# Patient Record
Sex: Female | Born: 1995
Health system: Southern US, Community
[De-identification: ages and names within clinical notes are randomized; demographics above are authoritative.]

## PROBLEM LIST (undated history)

## (undated) DIAGNOSIS — F419 Anxiety disorder, unspecified: Secondary | ICD-10-CM

## (undated) DIAGNOSIS — F5000 Anorexia nervosa, unspecified: Secondary | ICD-10-CM

## (undated) HISTORY — DX: Anorexia nervosa, unspecified: F50.00

## (undated) HISTORY — DX: Anxiety disorder, unspecified: F41.9

---

## 2014-03-08 DIAGNOSIS — F509 Eating disorder, unspecified: Secondary | ICD-10-CM | POA: Insufficient documentation

## 2014-09-28 DIAGNOSIS — F4322 Adjustment disorder with anxiety: Secondary | ICD-10-CM | POA: Insufficient documentation

## 2016-03-05 DIAGNOSIS — F5001 Anorexia nervosa, restricting type: Secondary | ICD-10-CM | POA: Diagnosis not present

## 2016-03-08 DIAGNOSIS — F5001 Anorexia nervosa, restricting type: Secondary | ICD-10-CM | POA: Diagnosis not present

## 2016-03-13 DIAGNOSIS — F5001 Anorexia nervosa, restricting type: Secondary | ICD-10-CM | POA: Diagnosis not present

## 2016-03-14 DIAGNOSIS — F5001 Anorexia nervosa, restricting type: Secondary | ICD-10-CM | POA: Diagnosis not present

## 2016-03-15 DIAGNOSIS — Z124 Encounter for screening for malignant neoplasm of cervix: Secondary | ICD-10-CM | POA: Diagnosis not present

## 2016-03-15 DIAGNOSIS — R875 Abnormal microbiological findings in specimens from female genital organs: Secondary | ICD-10-CM | POA: Diagnosis not present

## 2016-03-20 DIAGNOSIS — F5001 Anorexia nervosa, restricting type: Secondary | ICD-10-CM | POA: Diagnosis not present

## 2016-03-21 DIAGNOSIS — F5001 Anorexia nervosa, restricting type: Secondary | ICD-10-CM | POA: Diagnosis not present

## 2016-03-27 DIAGNOSIS — F5001 Anorexia nervosa, restricting type: Secondary | ICD-10-CM | POA: Diagnosis not present

## 2016-03-28 DIAGNOSIS — F5001 Anorexia nervosa, restricting type: Secondary | ICD-10-CM | POA: Diagnosis not present

## 2016-04-03 DIAGNOSIS — F5001 Anorexia nervosa, restricting type: Secondary | ICD-10-CM | POA: Diagnosis not present

## 2016-04-04 DIAGNOSIS — F5001 Anorexia nervosa, restricting type: Secondary | ICD-10-CM | POA: Diagnosis not present

## 2016-04-10 DIAGNOSIS — F5001 Anorexia nervosa, restricting type: Secondary | ICD-10-CM | POA: Diagnosis not present

## 2016-04-11 DIAGNOSIS — F5001 Anorexia nervosa, restricting type: Secondary | ICD-10-CM | POA: Diagnosis not present

## 2016-04-16 DIAGNOSIS — F5001 Anorexia nervosa, restricting type: Secondary | ICD-10-CM | POA: Diagnosis not present

## 2016-04-18 DIAGNOSIS — F5001 Anorexia nervosa, restricting type: Secondary | ICD-10-CM | POA: Diagnosis not present

## 2016-04-24 DIAGNOSIS — F5001 Anorexia nervosa, restricting type: Secondary | ICD-10-CM | POA: Diagnosis not present

## 2016-04-25 DIAGNOSIS — F5001 Anorexia nervosa, restricting type: Secondary | ICD-10-CM | POA: Diagnosis not present

## 2016-05-01 DIAGNOSIS — F5001 Anorexia nervosa, restricting type: Secondary | ICD-10-CM | POA: Diagnosis not present

## 2016-05-07 DIAGNOSIS — F5001 Anorexia nervosa, restricting type: Secondary | ICD-10-CM | POA: Diagnosis not present

## 2016-05-13 DIAGNOSIS — F5001 Anorexia nervosa, restricting type: Secondary | ICD-10-CM | POA: Diagnosis not present

## 2016-05-22 DIAGNOSIS — F5001 Anorexia nervosa, restricting type: Secondary | ICD-10-CM | POA: Diagnosis not present

## 2016-05-23 DIAGNOSIS — F5001 Anorexia nervosa, restricting type: Secondary | ICD-10-CM | POA: Diagnosis not present

## 2016-05-29 DIAGNOSIS — F5001 Anorexia nervosa, restricting type: Secondary | ICD-10-CM | POA: Diagnosis not present

## 2016-05-30 DIAGNOSIS — F5001 Anorexia nervosa, restricting type: Secondary | ICD-10-CM | POA: Diagnosis not present

## 2016-06-05 DIAGNOSIS — F5001 Anorexia nervosa, restricting type: Secondary | ICD-10-CM | POA: Diagnosis not present

## 2016-06-06 DIAGNOSIS — F5001 Anorexia nervosa, restricting type: Secondary | ICD-10-CM | POA: Diagnosis not present

## 2016-06-12 DIAGNOSIS — F5001 Anorexia nervosa, restricting type: Secondary | ICD-10-CM | POA: Diagnosis not present

## 2016-06-13 DIAGNOSIS — F5001 Anorexia nervosa, restricting type: Secondary | ICD-10-CM | POA: Diagnosis not present

## 2016-06-19 DIAGNOSIS — F5001 Anorexia nervosa, restricting type: Secondary | ICD-10-CM | POA: Diagnosis not present

## 2016-06-20 DIAGNOSIS — F5001 Anorexia nervosa, restricting type: Secondary | ICD-10-CM | POA: Diagnosis not present

## 2016-06-26 DIAGNOSIS — F5001 Anorexia nervosa, restricting type: Secondary | ICD-10-CM | POA: Diagnosis not present

## 2016-06-27 DIAGNOSIS — F5001 Anorexia nervosa, restricting type: Secondary | ICD-10-CM | POA: Diagnosis not present

## 2016-07-03 DIAGNOSIS — F5001 Anorexia nervosa, restricting type: Secondary | ICD-10-CM | POA: Diagnosis not present

## 2016-07-04 DIAGNOSIS — F5001 Anorexia nervosa, restricting type: Secondary | ICD-10-CM | POA: Diagnosis not present

## 2016-07-10 DIAGNOSIS — F5001 Anorexia nervosa, restricting type: Secondary | ICD-10-CM | POA: Diagnosis not present

## 2016-07-17 DIAGNOSIS — F5001 Anorexia nervosa, restricting type: Secondary | ICD-10-CM | POA: Diagnosis not present

## 2016-07-18 DIAGNOSIS — F5001 Anorexia nervosa, restricting type: Secondary | ICD-10-CM | POA: Diagnosis not present

## 2016-07-24 DIAGNOSIS — F5001 Anorexia nervosa, restricting type: Secondary | ICD-10-CM | POA: Diagnosis not present

## 2016-07-31 DIAGNOSIS — F5001 Anorexia nervosa, restricting type: Secondary | ICD-10-CM | POA: Diagnosis not present

## 2016-08-01 DIAGNOSIS — F5001 Anorexia nervosa, restricting type: Secondary | ICD-10-CM | POA: Diagnosis not present

## 2016-08-07 DIAGNOSIS — F5001 Anorexia nervosa, restricting type: Secondary | ICD-10-CM | POA: Diagnosis not present

## 2016-08-13 DIAGNOSIS — F5001 Anorexia nervosa, restricting type: Secondary | ICD-10-CM | POA: Diagnosis not present

## 2016-08-15 DIAGNOSIS — F5001 Anorexia nervosa, restricting type: Secondary | ICD-10-CM | POA: Diagnosis not present

## 2016-08-20 DIAGNOSIS — F5001 Anorexia nervosa, restricting type: Secondary | ICD-10-CM | POA: Diagnosis not present

## 2016-08-28 DIAGNOSIS — F5001 Anorexia nervosa, restricting type: Secondary | ICD-10-CM | POA: Diagnosis not present

## 2016-08-29 DIAGNOSIS — F5001 Anorexia nervosa, restricting type: Secondary | ICD-10-CM | POA: Diagnosis not present

## 2016-09-04 DIAGNOSIS — F5001 Anorexia nervosa, restricting type: Secondary | ICD-10-CM | POA: Diagnosis not present

## 2016-09-11 DIAGNOSIS — F5001 Anorexia nervosa, restricting type: Secondary | ICD-10-CM | POA: Diagnosis not present

## 2016-09-12 DIAGNOSIS — F5001 Anorexia nervosa, restricting type: Secondary | ICD-10-CM | POA: Diagnosis not present

## 2016-10-02 DIAGNOSIS — F5001 Anorexia nervosa, restricting type: Secondary | ICD-10-CM | POA: Diagnosis not present

## 2016-10-03 DIAGNOSIS — F5001 Anorexia nervosa, restricting type: Secondary | ICD-10-CM | POA: Diagnosis not present

## 2016-10-09 DIAGNOSIS — F5001 Anorexia nervosa, restricting type: Secondary | ICD-10-CM | POA: Diagnosis not present

## 2016-10-23 DIAGNOSIS — F5001 Anorexia nervosa, restricting type: Secondary | ICD-10-CM | POA: Diagnosis not present

## 2016-10-25 DIAGNOSIS — F5001 Anorexia nervosa, restricting type: Secondary | ICD-10-CM | POA: Diagnosis not present

## 2016-10-29 DIAGNOSIS — F5001 Anorexia nervosa, restricting type: Secondary | ICD-10-CM | POA: Diagnosis not present

## 2016-11-04 DIAGNOSIS — H5211 Myopia, right eye: Secondary | ICD-10-CM | POA: Diagnosis not present

## 2016-11-04 DIAGNOSIS — H5202 Hypermetropia, left eye: Secondary | ICD-10-CM | POA: Diagnosis not present

## 2016-11-10 DIAGNOSIS — F5001 Anorexia nervosa, restricting type: Secondary | ICD-10-CM | POA: Diagnosis not present

## 2016-11-12 DIAGNOSIS — F5001 Anorexia nervosa, restricting type: Secondary | ICD-10-CM | POA: Diagnosis not present

## 2016-11-19 DIAGNOSIS — F5001 Anorexia nervosa, restricting type: Secondary | ICD-10-CM | POA: Diagnosis not present

## 2016-11-21 DIAGNOSIS — F5001 Anorexia nervosa, restricting type: Secondary | ICD-10-CM | POA: Diagnosis not present

## 2016-11-26 DIAGNOSIS — F5001 Anorexia nervosa, restricting type: Secondary | ICD-10-CM | POA: Diagnosis not present

## 2016-12-05 DIAGNOSIS — F5001 Anorexia nervosa, restricting type: Secondary | ICD-10-CM | POA: Diagnosis not present

## 2016-12-10 DIAGNOSIS — F5001 Anorexia nervosa, restricting type: Secondary | ICD-10-CM | POA: Diagnosis not present

## 2016-12-17 DIAGNOSIS — F5001 Anorexia nervosa, restricting type: Secondary | ICD-10-CM | POA: Diagnosis not present

## 2016-12-23 DIAGNOSIS — L700 Acne vulgaris: Secondary | ICD-10-CM | POA: Diagnosis not present

## 2016-12-24 DIAGNOSIS — F5001 Anorexia nervosa, restricting type: Secondary | ICD-10-CM | POA: Diagnosis not present

## 2017-01-03 DIAGNOSIS — F5001 Anorexia nervosa, restricting type: Secondary | ICD-10-CM | POA: Diagnosis not present

## 2017-01-07 DIAGNOSIS — F5001 Anorexia nervosa, restricting type: Secondary | ICD-10-CM | POA: Diagnosis not present

## 2017-01-14 DIAGNOSIS — F5001 Anorexia nervosa, restricting type: Secondary | ICD-10-CM | POA: Diagnosis not present

## 2017-01-16 DIAGNOSIS — F5001 Anorexia nervosa, restricting type: Secondary | ICD-10-CM | POA: Diagnosis not present

## 2017-01-22 DIAGNOSIS — F509 Eating disorder, unspecified: Secondary | ICD-10-CM | POA: Diagnosis not present

## 2017-01-22 DIAGNOSIS — F4322 Adjustment disorder with anxiety: Secondary | ICD-10-CM | POA: Diagnosis not present

## 2017-02-04 DIAGNOSIS — F5001 Anorexia nervosa, restricting type: Secondary | ICD-10-CM | POA: Diagnosis not present

## 2017-02-13 DIAGNOSIS — F5001 Anorexia nervosa, restricting type: Secondary | ICD-10-CM | POA: Diagnosis not present

## 2017-02-19 DIAGNOSIS — F5001 Anorexia nervosa, restricting type: Secondary | ICD-10-CM | POA: Diagnosis not present

## 2017-02-25 DIAGNOSIS — F5001 Anorexia nervosa, restricting type: Secondary | ICD-10-CM | POA: Diagnosis not present

## 2017-02-27 DIAGNOSIS — F5001 Anorexia nervosa, restricting type: Secondary | ICD-10-CM | POA: Diagnosis not present

## 2017-03-11 DIAGNOSIS — F5001 Anorexia nervosa, restricting type: Secondary | ICD-10-CM | POA: Diagnosis not present

## 2017-03-25 DIAGNOSIS — F5001 Anorexia nervosa, restricting type: Secondary | ICD-10-CM | POA: Diagnosis not present

## 2017-04-01 DIAGNOSIS — F5001 Anorexia nervosa, restricting type: Secondary | ICD-10-CM | POA: Diagnosis not present

## 2017-04-08 DIAGNOSIS — F5001 Anorexia nervosa, restricting type: Secondary | ICD-10-CM | POA: Diagnosis not present

## 2017-04-16 DIAGNOSIS — F5001 Anorexia nervosa, restricting type: Secondary | ICD-10-CM | POA: Diagnosis not present

## 2017-04-24 DIAGNOSIS — F5001 Anorexia nervosa, restricting type: Secondary | ICD-10-CM | POA: Diagnosis not present

## 2017-04-28 DIAGNOSIS — F4322 Adjustment disorder with anxiety: Secondary | ICD-10-CM | POA: Diagnosis not present

## 2017-04-28 DIAGNOSIS — F509 Eating disorder, unspecified: Secondary | ICD-10-CM | POA: Diagnosis not present

## 2017-05-08 DIAGNOSIS — F5001 Anorexia nervosa, restricting type: Secondary | ICD-10-CM | POA: Diagnosis not present

## 2017-05-20 DIAGNOSIS — F5001 Anorexia nervosa, restricting type: Secondary | ICD-10-CM | POA: Diagnosis not present

## 2017-05-21 DIAGNOSIS — F5001 Anorexia nervosa, restricting type: Secondary | ICD-10-CM | POA: Diagnosis not present

## 2017-05-27 DIAGNOSIS — F5001 Anorexia nervosa, restricting type: Secondary | ICD-10-CM | POA: Diagnosis not present

## 2017-06-09 DIAGNOSIS — F5001 Anorexia nervosa, restricting type: Secondary | ICD-10-CM | POA: Diagnosis not present

## 2017-06-16 DIAGNOSIS — F5001 Anorexia nervosa, restricting type: Secondary | ICD-10-CM | POA: Diagnosis not present

## 2017-06-23 DIAGNOSIS — F5001 Anorexia nervosa, restricting type: Secondary | ICD-10-CM | POA: Diagnosis not present

## 2017-06-24 DIAGNOSIS — F5001 Anorexia nervosa, restricting type: Secondary | ICD-10-CM | POA: Diagnosis not present

## 2017-06-30 DIAGNOSIS — F5001 Anorexia nervosa, restricting type: Secondary | ICD-10-CM | POA: Diagnosis not present

## 2017-07-07 DIAGNOSIS — F5001 Anorexia nervosa, restricting type: Secondary | ICD-10-CM | POA: Diagnosis not present

## 2017-07-15 DIAGNOSIS — F5001 Anorexia nervosa, restricting type: Secondary | ICD-10-CM | POA: Diagnosis not present

## 2017-07-21 DIAGNOSIS — F5001 Anorexia nervosa, restricting type: Secondary | ICD-10-CM | POA: Diagnosis not present

## 2017-07-28 DIAGNOSIS — F5001 Anorexia nervosa, restricting type: Secondary | ICD-10-CM | POA: Diagnosis not present

## 2017-08-01 DIAGNOSIS — F5001 Anorexia nervosa, restricting type: Secondary | ICD-10-CM | POA: Diagnosis not present

## 2017-08-11 DIAGNOSIS — F5001 Anorexia nervosa, restricting type: Secondary | ICD-10-CM | POA: Diagnosis not present

## 2017-08-18 DIAGNOSIS — F5001 Anorexia nervosa, restricting type: Secondary | ICD-10-CM | POA: Diagnosis not present

## 2017-08-19 DIAGNOSIS — F5001 Anorexia nervosa, restricting type: Secondary | ICD-10-CM | POA: Diagnosis not present

## 2017-08-25 DIAGNOSIS — F5001 Anorexia nervosa, restricting type: Secondary | ICD-10-CM | POA: Diagnosis not present

## 2017-08-28 DIAGNOSIS — F4325 Adjustment disorder with mixed disturbance of emotions and conduct: Secondary | ICD-10-CM | POA: Diagnosis not present

## 2017-09-04 DIAGNOSIS — F4325 Adjustment disorder with mixed disturbance of emotions and conduct: Secondary | ICD-10-CM | POA: Diagnosis not present

## 2017-09-10 DIAGNOSIS — F4325 Adjustment disorder with mixed disturbance of emotions and conduct: Secondary | ICD-10-CM | POA: Diagnosis not present

## 2017-09-10 DIAGNOSIS — F5001 Anorexia nervosa, restricting type: Secondary | ICD-10-CM | POA: Diagnosis not present

## 2017-09-15 DIAGNOSIS — F5001 Anorexia nervosa, restricting type: Secondary | ICD-10-CM | POA: Diagnosis not present

## 2017-10-06 DIAGNOSIS — F5001 Anorexia nervosa, restricting type: Secondary | ICD-10-CM | POA: Diagnosis not present

## 2017-10-15 DIAGNOSIS — F4325 Adjustment disorder with mixed disturbance of emotions and conduct: Secondary | ICD-10-CM | POA: Diagnosis not present

## 2017-10-20 DIAGNOSIS — F5001 Anorexia nervosa, restricting type: Secondary | ICD-10-CM | POA: Diagnosis not present

## 2017-10-28 DIAGNOSIS — F4325 Adjustment disorder with mixed disturbance of emotions and conduct: Secondary | ICD-10-CM | POA: Diagnosis not present

## 2017-11-09 DIAGNOSIS — F4325 Adjustment disorder with mixed disturbance of emotions and conduct: Secondary | ICD-10-CM | POA: Diagnosis not present

## 2017-11-14 DIAGNOSIS — H10413 Chronic giant papillary conjunctivitis, bilateral: Secondary | ICD-10-CM | POA: Diagnosis not present

## 2017-11-14 DIAGNOSIS — H5202 Hypermetropia, left eye: Secondary | ICD-10-CM | POA: Diagnosis not present

## 2017-11-19 DIAGNOSIS — F5001 Anorexia nervosa, restricting type: Secondary | ICD-10-CM | POA: Diagnosis not present

## 2017-12-01 DIAGNOSIS — F5000 Anorexia nervosa, unspecified: Secondary | ICD-10-CM | POA: Diagnosis not present

## 2017-12-15 DIAGNOSIS — F5000 Anorexia nervosa, unspecified: Secondary | ICD-10-CM | POA: Diagnosis not present

## 2017-12-29 DIAGNOSIS — F5000 Anorexia nervosa, unspecified: Secondary | ICD-10-CM | POA: Diagnosis not present

## 2018-01-07 DIAGNOSIS — L700 Acne vulgaris: Secondary | ICD-10-CM | POA: Diagnosis not present

## 2018-01-07 DIAGNOSIS — F4325 Adjustment disorder with mixed disturbance of emotions and conduct: Secondary | ICD-10-CM | POA: Diagnosis not present

## 2018-01-12 DIAGNOSIS — F5000 Anorexia nervosa, unspecified: Secondary | ICD-10-CM | POA: Diagnosis not present

## 2018-01-15 DIAGNOSIS — F4325 Adjustment disorder with mixed disturbance of emotions and conduct: Secondary | ICD-10-CM | POA: Diagnosis not present

## 2018-01-19 DIAGNOSIS — F5000 Anorexia nervosa, unspecified: Secondary | ICD-10-CM | POA: Diagnosis not present

## 2018-01-20 ENCOUNTER — Encounter: Payer: Self-pay | Admitting: Pediatrics

## 2018-01-20 ENCOUNTER — Ambulatory Visit (INDEPENDENT_AMBULATORY_CARE_PROVIDER_SITE_OTHER): Payer: BLUE CROSS/BLUE SHIELD | Admitting: Pediatrics

## 2018-01-20 ENCOUNTER — Ambulatory Visit (INDEPENDENT_AMBULATORY_CARE_PROVIDER_SITE_OTHER): Payer: BLUE CROSS/BLUE SHIELD | Admitting: Clinical

## 2018-01-20 VITALS — BP 107/64 | HR 64 | Ht 66.93 in | Wt 111.0 lb

## 2018-01-20 DIAGNOSIS — G43109 Migraine with aura, not intractable, without status migrainosus: Secondary | ICD-10-CM | POA: Insufficient documentation

## 2018-01-20 DIAGNOSIS — Z1389 Encounter for screening for other disorder: Secondary | ICD-10-CM

## 2018-01-20 DIAGNOSIS — N911 Secondary amenorrhea: Secondary | ICD-10-CM | POA: Diagnosis not present

## 2018-01-20 DIAGNOSIS — Z3202 Encounter for pregnancy test, result negative: Secondary | ICD-10-CM

## 2018-01-20 DIAGNOSIS — R636 Underweight: Secondary | ICD-10-CM | POA: Diagnosis not present

## 2018-01-20 DIAGNOSIS — F4322 Adjustment disorder with anxiety: Secondary | ICD-10-CM | POA: Diagnosis not present

## 2018-01-20 DIAGNOSIS — R001 Bradycardia, unspecified: Secondary | ICD-10-CM | POA: Diagnosis not present

## 2018-01-20 DIAGNOSIS — Z113 Encounter for screening for infections with a predominantly sexual mode of transmission: Secondary | ICD-10-CM

## 2018-01-20 DIAGNOSIS — F5001 Anorexia nervosa, restricting type: Secondary | ICD-10-CM

## 2018-01-20 LAB — POCT URINALYSIS DIPSTICK
Bilirubin, UA: NEGATIVE
Blood, UA: NEGATIVE
Glucose, UA: NEGATIVE
KETONES UA: NEGATIVE
Leukocytes, UA: NEGATIVE
NITRITE UA: NEGATIVE
PROTEIN UA: NEGATIVE
Spec Grav, UA: 1.015 (ref 1.010–1.025)
Urobilinogen, UA: NEGATIVE E.U./dL — AB
pH, UA: 5 (ref 5.0–8.0)

## 2018-01-20 LAB — LIPASE: LIPASE: 26 U/L (ref 7–60)

## 2018-01-20 LAB — COMPREHENSIVE METABOLIC PANEL
AG Ratio: 2.3 (calc) (ref 1.0–2.5)
ALBUMIN MSPROF: 5.1 g/dL (ref 3.6–5.1)
ALT: 55 U/L — ABNORMAL HIGH (ref 6–29)
AST: 45 U/L — AB (ref 10–30)
Alkaline phosphatase (APISO): 66 U/L (ref 33–115)
BUN: 17 mg/dL (ref 7–25)
CHLORIDE: 105 mmol/L (ref 98–110)
CO2: 30 mmol/L (ref 20–32)
CREATININE: 0.78 mg/dL (ref 0.50–1.10)
Calcium: 10.3 mg/dL — ABNORMAL HIGH (ref 8.6–10.2)
GLOBULIN: 2.2 g/dL (ref 1.9–3.7)
GLUCOSE: 92 mg/dL (ref 65–99)
POTASSIUM: 5.3 mmol/L (ref 3.5–5.3)
SODIUM: 142 mmol/L (ref 135–146)
TOTAL PROTEIN: 7.3 g/dL (ref 6.1–8.1)
Total Bilirubin: 0.4 mg/dL (ref 0.2–1.2)

## 2018-01-20 LAB — CBC WITH DIFFERENTIAL/PLATELET
Basophils Absolute: 31 cells/uL (ref 0–200)
Basophils Relative: 0.8 %
EOS ABS: 133 {cells}/uL (ref 15–500)
Eosinophils Relative: 3.4 %
HEMATOCRIT: 41.2 % (ref 35.0–45.0)
Hemoglobin: 14.2 g/dL (ref 11.7–15.5)
LYMPHS ABS: 1166 {cells}/uL (ref 850–3900)
MCH: 32.5 pg (ref 27.0–33.0)
MCHC: 34.5 g/dL (ref 32.0–36.0)
MCV: 94.3 fL (ref 80.0–100.0)
MPV: 11.6 fL (ref 7.5–12.5)
Monocytes Relative: 9.3 %
Neutro Abs: 2207 cells/uL (ref 1500–7800)
Neutrophils Relative %: 56.6 %
PLATELETS: 239 10*3/uL (ref 140–400)
RBC: 4.37 10*6/uL (ref 3.80–5.10)
RDW: 11.1 % (ref 11.0–15.0)
TOTAL LYMPHOCYTE: 29.9 %
WBC: 3.9 10*3/uL (ref 3.8–10.8)
WBCMIX: 363 {cells}/uL (ref 200–950)

## 2018-01-20 LAB — THYROID PANEL WITH TSH
Free Thyroxine Index: 1.6 (ref 1.4–3.8)
T3 UPTAKE: 26 % (ref 22–35)
T4, Total: 6.2 ug/dL (ref 5.1–11.9)
TSH: 2.99 m[IU]/L

## 2018-01-20 LAB — MAGNESIUM: Magnesium: 2.2 mg/dL (ref 1.5–2.5)

## 2018-01-20 LAB — PHOSPHORUS: Phosphorus: 3.8 mg/dL (ref 2.5–4.5)

## 2018-01-20 LAB — AMYLASE: AMYLASE: 50 U/L (ref 21–101)

## 2018-01-20 LAB — SEDIMENTATION RATE: Sed Rate: 2 mm/h (ref 0–20)

## 2018-01-20 LAB — POCT URINE PREGNANCY: Preg Test, Ur: NEGATIVE

## 2018-01-20 MED ORDER — FLUOXETINE HCL 20 MG PO CAPS: 20.0000 mg | ORAL_CAPSULE | Freq: Every day | ORAL | 3 refills | Status: DC

## 2018-01-20 NOTE — Patient Instructions (Addendum)
Labs today. 24-48 hours and I will get them back to you.  EKG scheduling- [phone] We have ordered bone density- they will call you to schedule  Continue ensure twice a day  Continue with less than 15 minutes of walking   Restart fluoxetine 20 mg daily  We will see you in 2 weeks.   Review birth control information. A great website to look at is www.bedsider.org. You can compare all the methods there.  Continue using condoms all the time until you have birth control.

## 2018-01-20 NOTE — Progress Notes (Signed)
THIS RECORD MAY CONTAIN CONFIDENTIAL INFORMATION THAT SHOULD NOT BE RELEASED WITHOUT REVIEW OF THE SERVICE PROVIDER.  Adolescent Medicine Consultation Initial Visit Jolonda Gomm  is a 22 y.o. female referred by No ref. provider found here today for evaluation of anxiety, disordered eating, secondary amenorrhea, underweight.      Review of records?  yes  Pertinent Labs? No  Growth Chart Viewed? yes   History was provided by the patient.  PCP Confirmed?  yes     Chief Complaint  Patient presents with  . New Patient (Initial Visit)    HPI:    Hx of eating disorder, was restored for some time but had dropped off some and then even more recently. Lives by herself with her dog.  Sleeps well but goes to bed late and wakes up early.  Considers herself to have a limited diet.  Sexually active- not currently using contraception.  No history of trauma.  Weekly therapist- Gevena Mart, dietitian every other week- Luther Parody.  No SI/HI.  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 She has a BA from [employer] and an MBA from [employer].   B: oatmeal, banana, english muffin L: deli sandwich and chips  D: chicken, green beans and rice  S: granola bar, yogurt, trail mix  Drinks 16-32 oz water, 1-2 cups of coffee Drinks 2 bottles of ensure a day.   Walks dog for exercise every day. Attempts to walk less than a mile.   Goals are to have a health check in and possibly restarting medication. Feeling more anxious after stopping it- had been on prozac up to 40 mg daily. Isn't exactly sure that it worked, but did not have side effects.   DE started in 2015 a few years into college at [employer]. Lowest adult weight was 94 pounds. Did outpatient treatment with Megan and used to see a different therapist. She was weight restored to 125 pounds at one point and was cycling regularly. She continues to have anxiety around different foods. Family and boyfriend supportive.   Likes to do yoga- 1-2  times a week. Started period at 54. Were regular. Stopped menstruating around 2015, resumed in about 2016. Been about 6 months now. Thinks she had a bone density- thinks it was normal.   Takes womens one a day vitamin. Takes generic bactrim for acne. Been on that for several years. Mostly facial acne. Has never tried to stop. Sees dermatologist for that. Never used birth control. Sometimes uses condoms. Twin sister had used nexplanon with hair loss and mom has been on OCPs with no issues. Patient has history of migraine with aura. She is considering IUD.    Patient's last menstrual period was 08/22/2017 (approximate).  Review of Systems  HENT: Negative for mouth sores, nosebleeds, sore throat and trouble swallowing.   Eyes: Negative for visual disturbance.  Respiratory: Negative for shortness of breath.   Cardiovascular: Negative for chest pain and palpitations.  Gastrointestinal: Negative for abdominal pain, constipation, nausea and vomiting.  Endocrine: Negative for cold intolerance.  Genitourinary: Negative for dysuria, pelvic pain and vaginal discharge.  Musculoskeletal: Negative for myalgias.  Skin: Negative for rash.  Neurological: Negative for dizziness and headaches.  Hematological: Does not bruise/bleed easily.  Psychiatric/Behavioral: Negative for sleep disturbance and suicidal ideas. The patient is nervous/anxious.     No Known Allergies Outpatient Medications Prior to Visit  Medication Sig Dispense Refill  . Multiple Vitamin (MULTIVITAMIN) tablet Take 1 tablet by mouth daily.    Marland Kitchen  sulfamethoxazole-trimethoprim (BACTRIM,SEPTRA) 400-80 MG tablet Take 1 tablet by mouth 2 (two) times daily.     No facility-administered medications prior to visit.      Patient Active Problem List   Diagnosis Date Noted  . Underweight 01/20/2018  . Bradycardia 01/20/2018  . Secondary amenorrhea 01/20/2018  . Migraine with aura 01/20/2018  . Adjustment disorder with anxiety 09/28/2014  .  Eating disorder 03/08/2014    Past Medical History:  Reviewed and updated?  yes Past Medical History:  Diagnosis Date  . Anorexia nervosa   . Anxiety     Family History: Reviewed and updated? yes Family History  Problem Relation Age of Onset  . Anxiety disorder Mother     Social History:  Activities:  Special interests/hobbies/sports: yoga, walking dog   Lifestyle habits that can impact QOL: Sleep: sleeping well  Eating habits/patterns: as above Water intake: could use more  Exercise: as above   Confidentiality was discussed with the patient and if applicable, with caregiver as well.  Gender identity: female Sex assigned at birth: female Pronouns: she Tobacco?  no Drugs/ETOH?  no Partner preference?  female  Sexually Active?  yes  Pregnancy Prevention:  condoms Reviewed condoms:  yes Reviewed EC:  yes   History or current traumatic events (natural disaster, house fire, etc.)? no History or current physical trauma?  no History or current emotional trauma?  no History or current sexual trauma?  no History or current domestic or intimate partner violence?  no History of bullying:  no   Suicidal or homicidal thoughts?   no Self injurious behaviors?  no Guns in the home?  no   The following portions of the patient's history were reviewed and updated as appropriate: allergies, current medications, past family history, past medical history, past social history, past surgical history and problem list.  Physical Exam:  Vitals:   01/20/18 0843 01/20/18 0854  BP: 103/64 107/64  Pulse: (!) 54 64  Weight: 111 lb (50.3 kg)   Height: 5' 6.93" (1.7 m)    BP 107/64   Pulse 64   Ht 5' 6.93" (1.7 m)   Wt 111 lb (50.3 kg)   LMP 08/22/2017 (Approximate)   BMI 17.42 kg/m  Body mass index: body mass index is 17.42 kg/m. Growth percentile SmartLinks can only be used for patients less than 24 years old.   Physical Exam  Constitutional: She appears well-developed. No  distress.  HENT:  Mouth/Throat: Oropharynx is clear and moist.  Eyes: Pupils are equal, round, and reactive to light. EOM are normal.  Neck: No thyromegaly present.  Cardiovascular: Normal rate and regular rhythm.  No murmur heard. Pulmonary/Chest: Breath sounds normal.  Abdominal: Soft. She exhibits no mass. There is no tenderness. There is no guarding.  Musculoskeletal: She exhibits no edema.  Lymphadenopathy:    She has no cervical adenopathy.  Neurological: She is alert.  Skin: Skin is warm. No rash noted.  Psychiatric: She has a normal mood and affect.  Nursing note and vitals reviewed.    Assessment/Plan: 1. Adjustment disorder with anxiety Will restart prozac 20 mg with low threshold to increase or change to zoloft or lexapro if needed. Patient in agreement with plan. She will continue to see therapist weekly.   2. Anorexia nervosa, restricting type Seeing dietitian every other week. She should continue ensure and work to increase meal plan with dietitian. Discussed that she can continue to walk her dog slowly and no more than 15 minutes at a time.  -  Amylase - Lipase - Magnesium - Phosphorus - Sedimentation rate - Thyroid Panel With TSH - Comprehensive metabolic panel - DG Bone Density; Future - CBC with Differential/Platelet - DG Bone Density  3. Underweight Is now clinically underweight and is not having a period. Amenorrhea x 6 months. Will get labs today and an EKG and bone density to assess bone health. Discussed adding in vitamin D and calcium to optimize bone health.  - Amylase - Lipase - Magnesium - Phosphorus - Sedimentation rate - Thyroid Panel With TSH - Comprehensive metabolic panel - EKG 12-Lead - DG Bone Density; Future - DG Bone Density  4. Bradycardia Will get EKG and monitor heart rate closely. She is not having orthostatic VS or symptoms at this time.  - EKG 12-Lead - DG Bone Density; Future - DG Bone Density  5. Migraine with aura and  without status migrainosus, not intractable Has about 2-3 migraines a year with aura-- this makes her a poor candidate for estrogen containing birth control methods. We discussed this today.   6. Secondary amenorrhea Discussed that she should still consider birth control since she is sexually active. Discussed depo, IUD and nexplanon. She is considering IUD. Sent with brochure today.   7. Routine screening for STI (sexually transmitted infection) Per protocol.  - C. trachomatis/N. gonorrhoeae RNA  8. Screening for genitourinary condition Normal.  - POCT urinalysis dipstick  9. Pregnancy examination or test, negative result Neg.  - POCT urine pregnancy    BH screenings: PHQSADs and EAT26 reviewed and indicated mild anxiety, no depression, mildly positive EAT26. Screens discussed with patient and parent and adjustments to plan made accordingly.    Follow-up:   2 weeks for med and lab f/u  Medical decision-making:  >45 minutes spent face to face with patient with more than 50% of appointment spent discussing diagnosis, management, follow-up, and reviewing of anxiety, disordered eating, birth control, amenorrhea, bradycardia.  CC: Angelica Chessman, MD, No ref. provider found

## 2018-01-20 NOTE — BH Specialist Note (Signed)
Integrated Behavioral Health Initial Visit  MRN: [id number] Name: Carolyn Smith Insley  Number of Integrated Behavioral Health Clinician visits:: 1/6 Session Start time: 8:55am  Session End time: 9:15am Total time: 20 minutes  Type of Service: Integrated Behavioral Health- Individual/Family Interpretor:No. Interpretor Name and Language: n/a   Warm Hand Off Completed.       SUBJECTIVE: Carolyn Smith Savoca is a 22 y.o. female accompanied by no one Patient was referred by resident Jacinto Reapoman Melvin, MD and Alfonso Ramusaroline Hacker, NP for disordered eating. Patient reports the following symptoms/concerns: fear of gaining weight, food restricting, mild anxiety. Duration of problem: years; Severity of problem: moderate  OBJECTIVE: Mood: Euthymic and Affect: Appropriate Risk of harm to self or others: No plan to harm self or others  LIFE CONTEXT: Family and Social: lives alone School/Work: full-time Self-Care: yoga, walking dog, watching tv Life Changes: dog died  Social History:  Lifestyle habits that can impact QOL: Sleep:midnight -- 6 - 6:30, sleep well  Eating habits/patterns: fruits and vegetables Breakfast: oatmeal, banana, english muffin Lunch: deli sandwhich, chips Dinner: chicken, green beans, rice Snacks: granola bar, yogurt, trail mix Water intake: water 16oz 1-2x/day, coffee (1-2 cups) Screen time: 9 hrs ish Exercise: walking dog   Confidentiality was discussed with the patient and if applicable, with caregiver as well.  Gender identity: female Sex assigned at birth: female Pronouns: she Tobacco?  no Drugs/ETOH?  yes, not currently, have in the past for fun Partner preference?  female  Sexually Active?  yes  Pregnancy Prevention:  none Reviewed condoms:  no Reviewed EC:  no   History or current traumatic events (natural disaster, house fire, etc.)? no History or current physical trauma?  no History or current emotional trauma? no History or current sexual trauma? no History or  current domestic or intimate partner violence?  no History of bullying:  no  Trusted adult:  yes Feels safe at home:  yes Trusted friends:  yes Feels safe at school:  n/a  Suicidal or homicidal thoughts?   no Self injurious behaviors?  no Guns in the home?  no   GOALS ADDRESSED: Patient will: 1. Demonstrate ability to: Increase healthy adjustment to current life circumstances and ongoing healthy eating habits.   INTERVENTIONS: Interventions utilized: Psychoeducation and/or Health Education  Standardized Assessments completed: EAT-26 and PHQ-SADS  ASSESSMENT: Patient currently experiencing restricted eating, weight loss, fear of gaining weight, more anxious thoughts since stopping fluoxetine last year.   Patient may benefit from continuing to see therapist Gevena Mart([name]) and nutritionist [facility](Megan Hadley), restart fluoxetine 20 mg, per medical provider's direction, and return to see medical provider for medication follow-up in 6 weeks.  PLAN: 1. Follow up with behavioral health clinician on : as needed. 2. Behavioral recommendations: continuing to see therapist Gevena Mart([name]) and nutritionist Luther Parody(Megan Hadley), take medication as prescribed and [facility]  3. Referral(s): [facility] 4. "From scale of 1-10, how likely are you to follow plan?": patient in agreement with plan  Beryl MeagerKathleen Maloney, B.A. Behavioral Health Intern   Beryl MeagerKathleen Maloney

## 2018-01-20 NOTE — BH Specialist Note (Signed)
Integrated Behavioral Health Initial Visit  MRN: [id number] Name: Carolyn Smith  Number of Integrated Behavioral Health Clinician visits:: 1/6 Session Start time: 8:55am  Session End time: 9:15am Total time: 20 minutes  Type of Service: Integrated Behavioral Health- Individual/Family Interpretor:No. Interpretor Name and Language: n/a   Warm Hand Off Completed.       SUBJECTIVE: Carolyn Smith is a 22 y.o. female accompanied by self Patient was referred by Registered Dietician Carolyn Smith, C. Carolyn CaulHacker, FNP for disordered eating and anxiety Patient reports the following symptoms/concerns: anxiety - Was taking Fluoxitine for anxiety - stopped last year - on 40 mg Fluoxetine Duration of problem: years; Severity of problem: mild to moderate  OBJECTIVE: Mood: Anxious and Affect: Appropriate Risk of harm to self or others: No plan to harm self or others  LIFE CONTEXT: Family and Social: Lives alone with a dog School/Work: Working full time Self-Care: Walking the dog Life Changes: One of her dogs died Support system: Pharmacist, communityAngela Smith - Therapist every other week & Carolyn Smith - RD   Social History:  Lifestyle habits that can impact QOL: Sleep:Sleeps well, usually sleeps around midnight to 6/6:30am Eating habits/patterns: 3 meals a day with snacks, 24 [facility] Water intake: coffee & water Screen time: About 9 hours Exercise: walking the dog   Confidentiality was discussed with the patient and if applicable, with caregiver as well.  Gender identity: Female Sex assigned at birth: Female Pronouns: she Tobacco?  no Drugs/ETOH?  no, not currently, has drank alcohol in the past Partner preference?  female  Sexually Active?  yes  Pregnancy Prevention:  none Reviewed condoms:  no Reviewed EC:  no   History or current traumatic events (natural disaster, house fire, etc.)? no History or current physical trauma?  no History or current emotional trauma?   no History or current sexual trauma?  no History or current domestic or intimate partner violence?  no History of bullying:  no  Trusted adult at home/school:  yes Feels safe at home:  yes Trusted friends:  yes Feels safe at school:  N/A  Suicidal or homicidal thoughts?   no Self injurious behaviors?  no Guns in the home?  no   GOALS ADDRESSED: Patient will: 1. Reduce symptoms of: anxiety 2. Demonstrate ability to: Increase healthy adjustment to current life circumstances - ongoing healthy eating habits  INTERVENTIONS: Interventions utilized: Psychoeducation and/or Health Education  Standardized Assessments completed: EAT-26 and PHQ-SADS   PHQ SADS 01/20/2018  PHQ-15 Score-Somatic 1  Total GAD-7 Score-Anxiety 4  a. In the last 4 weeks, have you had an anxiety attack-suddenly feeling fear or panic? No  Thoughts that you would be better off dead, or of hurting yourself in some way 0  PHQ-9 Score Depression 1  If you checked off any problems on this questionnaire, how difficult have these problems made it for you to do your work, take care of things at home, or get along with other people? Not difficult at all   EAT-26 Screening Tool 01/20/2018  Total Score EAT-26 20  Gone on eating binges where you feel that you may not be able to stop? Never  Ever made yourself sick (vomited) to control your weight or shape? Never  Ever used laxatives, diet pills or diuretics (water pills) to control your weight or shape? Never  Exercised more than 60 minutes a day to lose or to control your weight? Never    ASSESSMENT: Patient currently experiencing anxiety and wanting treatment  for anxiety.  Anastasya experiencing restricted eating and recent weight loss.   Patient may benefit from continuing therapy with Carolyn Smith and services with Registered Dietician.  PLAN: 1. Follow up with behavioral health clinician on : No follow up with [facility] since seeing community based therapist 2. Behavioral  recommendations:  - Continue to see therapist, Carolyn Smith & Registered Dietician, - C. Carolyn Caul, FNP prescribed 20 mg Fluoxetine - take as prescribed  3. Referral(s): Integrated [facility] (In Clinic) 4. "From scale of 1-10, how likely are you to follow plan?": Not reported  Carolyn Savers, LCSW

## 2018-01-21 LAB — C. TRACHOMATIS/N. GONORRHOEAE RNA
C. trachomatis RNA, TMA: NOT DETECTED
N. gonorrhoeae RNA, TMA: NOT DETECTED

## 2018-01-21 LAB — TEST AUTHORIZATION

## 2018-01-22 DIAGNOSIS — F4325 Adjustment disorder with mixed disturbance of emotions and conduct: Secondary | ICD-10-CM | POA: Diagnosis not present

## 2018-01-22 LAB — ESTRADIOL: Estradiol: 61 pg/mL

## 2018-01-26 DIAGNOSIS — Z713 Dietary counseling and surveillance: Secondary | ICD-10-CM | POA: Diagnosis not present

## 2018-01-29 DIAGNOSIS — F4325 Adjustment disorder with mixed disturbance of emotions and conduct: Secondary | ICD-10-CM | POA: Diagnosis not present

## 2018-02-05 ENCOUNTER — Ambulatory Visit (INDEPENDENT_AMBULATORY_CARE_PROVIDER_SITE_OTHER): Payer: BLUE CROSS/BLUE SHIELD | Admitting: Pediatrics

## 2018-02-05 ENCOUNTER — Encounter: Payer: Self-pay | Admitting: Pediatrics

## 2018-02-05 VITALS — BP 110/75 | HR 64 | Ht 66.93 in | Wt 110.6 lb

## 2018-02-05 DIAGNOSIS — N911 Secondary amenorrhea: Secondary | ICD-10-CM

## 2018-02-05 DIAGNOSIS — F4322 Adjustment disorder with anxiety: Secondary | ICD-10-CM | POA: Diagnosis not present

## 2018-02-05 DIAGNOSIS — F5001 Anorexia nervosa, restricting type: Secondary | ICD-10-CM | POA: Diagnosis not present

## 2018-02-05 DIAGNOSIS — F50019 Anorexia nervosa, restricting type, unspecified: Secondary | ICD-10-CM

## 2018-02-05 DIAGNOSIS — Z1389 Encounter for screening for other disorder: Secondary | ICD-10-CM

## 2018-02-05 DIAGNOSIS — R001 Bradycardia, unspecified: Secondary | ICD-10-CM | POA: Diagnosis not present

## 2018-02-05 DIAGNOSIS — F4325 Adjustment disorder with mixed disturbance of emotions and conduct: Secondary | ICD-10-CM | POA: Diagnosis not present

## 2018-02-05 DIAGNOSIS — E441 Mild protein-calorie malnutrition: Secondary | ICD-10-CM | POA: Diagnosis not present

## 2018-02-05 LAB — POCT URINALYSIS DIPSTICK
Bilirubin, UA: NEGATIVE
Blood, UA: NEGATIVE
Glucose, UA: NEGATIVE
Ketones, UA: NEGATIVE
Leukocytes, UA: NEGATIVE
NITRITE UA: NEGATIVE
PH UA: 5 (ref 5.0–8.0)
PROTEIN UA: NEGATIVE
SPEC GRAV UA: 1.015 (ref 1.010–1.025)
Urobilinogen, UA: NEGATIVE E.U./dL — AB

## 2018-02-05 MED ORDER — ESCITALOPRAM OXALATE 10 MG PO TABS: 10.0000 mg | ORAL_TABLET | Freq: Every day | ORAL | 2 refills | Status: DC

## 2018-02-05 NOTE — Patient Instructions (Addendum)
Check out Ensure Enlive  Add calcium 600 mg twice a day and vitamin D at least 2000 IU daily   [facility]  [phone]

## 2018-02-05 NOTE — Progress Notes (Signed)
THIS RECORD MAY CONTAIN CONFIDENTIAL INFORMATION THAT SHOULD NOT BE RELEASED WITHOUT REVIEW OF THE SERVICE PROVIDER.  Adolescent Medicine Consultation Follow-Up Visit Carolyn Smith  is a 22 y.o. female referred by Angelica Chessman, MD here today for follow-up regarding disordered eating, anxiety, bradycardia, secondary amenorrhea.    Last seen in Adolescent Medicine Clinic on 01/20/18 for the above.  Plan at last visit included start prozac 20 mg daily. Schedule EKG and bone density; labs. Discussed contraception.  Pertinent Labs? Yes, labs WNL except elevated LFTs Growth Chart Viewed? yes   History was provided by the patient.  Interpreter? no  PCP Confirmed?  yes  My Chart Activated?   no   Chief Complaint  Patient presents with  . Follow-up  . Eating Disorder    HPI:    Started taking medication but hasn't really noticed anything. Feels that previously she didn't get much benefit  Added in a few snacks throughout the day. Doing ensure twice a day. Drinking ensure plus   24 hour recall:  B: oatmeal, banana, english muffin S: yogurt covered raisins, almonds + ensure L: [place] sandwich, lettuce, tomato, mayo, carrots and chips  S: [place] bar + ensure D: chickfila wrap grilled chicken and fruit cup S: yogurt, granola and strawberries   Anxiety about 5/10 over the week. Goal would be a 1.  Still walking dog daily about 10 minutes.   Still seeing Marylene Land weekly.   Still thinks that IUD would be good for her but doesn't want to commit to scheduling today.   Review of Systems  Constitutional: Negative for malaise/fatigue.  Eyes: Negative for double vision.  Respiratory: Negative for shortness of breath.   Cardiovascular: Negative for chest pain and palpitations.  Gastrointestinal: Negative for abdominal pain, diarrhea, nausea and vomiting.  Genitourinary: Negative for dysuria.  Musculoskeletal: Negative for joint pain and myalgias.  Skin: Negative for rash.   Neurological: Negative for dizziness and headaches.  Endo/Heme/Allergies: Does not bruise/bleed easily.  Psychiatric/Behavioral: Positive for depression. The patient is nervous/anxious.      No LMP recorded. No Known Allergies Outpatient Medications Prior to Visit  Medication Sig Dispense Refill  . FLUoxetine (PROZAC) 20 MG capsule Take 1 capsule (20 mg total) by mouth daily. 30 capsule 3  . Multiple Vitamin (MULTIVITAMIN) tablet Take 1 tablet by mouth daily.    Marland Kitchen sulfamethoxazole-trimethoprim (BACTRIM,SEPTRA) 400-80 MG tablet Take 1 tablet by mouth 2 (two) times daily.     No facility-administered medications prior to visit.      Patient Active Problem List   Diagnosis Date Noted  . Underweight 01/20/2018  . Bradycardia 01/20/2018  . Secondary amenorrhea 01/20/2018  . Migraine with aura 01/20/2018  . Adjustment disorder with anxiety 09/28/2014  . Eating disorder 03/08/2014      The following portions of the patient's history were reviewed and updated as appropriate: allergies, current medications, past family history, past medical history, past social history, past surgical history and problem list.  Physical Exam:  Vitals:   02/05/18 1337  BP: 110/75  Pulse: 64  Weight: 110 lb 9.6 oz (50.2 kg)  Height: 5' 6.93" (1.7 m)   BP 110/75   Pulse 64   Ht 5' 6.93" (1.7 m)   Wt 110 lb 9.6 oz (50.2 kg)   BMI 17.36 kg/m  Body mass index: body mass index is 17.36 kg/m. Growth percentile SmartLinks can only be used for patients less than 24 years old.   Physical Exam  Constitutional: She appears well-developed.  No distress.  HENT:  Mouth/Throat: Oropharynx is clear and moist.  Neck: No thyromegaly present.  Cardiovascular: Normal rate and regular rhythm.  No murmur heard. Pulmonary/Chest: Breath sounds normal.  Abdominal: Soft. She exhibits no mass. There is no tenderness. There is no guarding.  Musculoskeletal: She exhibits no edema.  Lymphadenopathy:    She has no  cervical adenopathy.  Neurological: She is alert.  Skin: Skin is warm. Capillary refill takes 2 to 3 seconds. No rash noted.  Psychiatric: She has a normal mood and affect.  Nursing note and vitals reviewed.   Assessment/Plan: 1. Mild protein-calorie malnutrition (HCC) Weight is stable, but has not made any progress with weight gain to this point. Discussed changing ensure to ensure enlive, which will have more protein in it given that she isn't eating a ton of protein throughout the day. She was agreeable to this. Continue to add more to meals.   2. Secondary amenorrhea Will schedule bone density. Estradiol 61, so not totally suppressed. Discussed this in relation to contraception and pregnancy risk.   3. Bradycardia HR slightly improved today.   4. Anorexia nervosa, restricting type Continues with treatment team.   5. Adjustment disorder with anxiety Will change prozac to lexapro given that she never really felt different on the prozac in the past. Can increase if needed in 4 weeks.  - escitalopram (LEXAPRO) 10 MG tablet; Take 1 tablet (10 mg total) by mouth daily.  Dispense: 30 tablet; Refill: 2  6. Screening for genitourinary condition WNL.  - POCT urinalysis dipstick   Follow-up:  4 weeks or sooner if needed   Medical decision-making:  >15 minutes spent face to face with patient with more than 50% of appointment spent discussing diagnosis, management, follow-up, and reviewing of malnutrition, amenorrhea, bradycardia, anxiety.

## 2018-02-09 ENCOUNTER — Other Ambulatory Visit (HOSPITAL_COMMUNITY): Payer: Self-pay

## 2018-02-09 DIAGNOSIS — F5000 Anorexia nervosa, unspecified: Secondary | ICD-10-CM | POA: Diagnosis not present

## 2018-02-10 ENCOUNTER — Ambulatory Visit (HOSPITAL_COMMUNITY)
Admission: RE | Admit: 2018-02-10 | Discharge: 2018-02-10 | Disposition: A | Discharge status: Home / Self Care | Payer: BLUE CROSS/BLUE SHIELD | Source: Ambulatory Visit | Attending: Pediatrics | Admitting: Pediatrics

## 2018-02-10 ENCOUNTER — Other Ambulatory Visit (HOSPITAL_COMMUNITY): Payer: Self-pay

## 2018-02-10 DIAGNOSIS — R001 Bradycardia, unspecified: Secondary | ICD-10-CM | POA: Diagnosis not present

## 2018-02-10 DIAGNOSIS — R636 Underweight: Secondary | ICD-10-CM | POA: Insufficient documentation

## 2018-02-20 DIAGNOSIS — F4325 Adjustment disorder with mixed disturbance of emotions and conduct: Secondary | ICD-10-CM | POA: Diagnosis not present

## 2018-02-24 DIAGNOSIS — F5000 Anorexia nervosa, unspecified: Secondary | ICD-10-CM | POA: Diagnosis not present

## 2018-03-03 DIAGNOSIS — F4325 Adjustment disorder with mixed disturbance of emotions and conduct: Secondary | ICD-10-CM | POA: Diagnosis not present

## 2018-03-09 DIAGNOSIS — F5000 Anorexia nervosa, unspecified: Secondary | ICD-10-CM | POA: Diagnosis not present

## 2018-03-12 DIAGNOSIS — F4325 Adjustment disorder with mixed disturbance of emotions and conduct: Secondary | ICD-10-CM | POA: Diagnosis not present

## 2018-03-16 ENCOUNTER — Ambulatory Visit (INDEPENDENT_AMBULATORY_CARE_PROVIDER_SITE_OTHER): Payer: BLUE CROSS/BLUE SHIELD | Admitting: Pediatrics

## 2018-03-16 ENCOUNTER — Encounter: Payer: Self-pay | Admitting: Pediatrics

## 2018-03-16 VITALS — BP 105/71 | HR 64 | Ht 66.93 in | Wt 107.8 lb

## 2018-03-16 DIAGNOSIS — F4322 Adjustment disorder with anxiety: Secondary | ICD-10-CM | POA: Diagnosis not present

## 2018-03-16 DIAGNOSIS — Z1389 Encounter for screening for other disorder: Secondary | ICD-10-CM

## 2018-03-16 DIAGNOSIS — E440 Moderate protein-calorie malnutrition: Secondary | ICD-10-CM | POA: Diagnosis not present

## 2018-03-16 DIAGNOSIS — N911 Secondary amenorrhea: Secondary | ICD-10-CM | POA: Diagnosis not present

## 2018-03-16 DIAGNOSIS — F5001 Anorexia nervosa, restricting type: Secondary | ICD-10-CM

## 2018-03-16 LAB — POCT URINALYSIS DIPSTICK
BILIRUBIN UA: NEGATIVE
GLUCOSE UA: NEGATIVE
Ketones, UA: NEGATIVE
LEUKOCYTES UA: NEGATIVE
Nitrite, UA: NEGATIVE
Protein, UA: NEGATIVE
RBC UA: NEGATIVE
Spec Grav, UA: 1.01 (ref 1.010–1.025)
Urobilinogen, UA: NEGATIVE E.U./dL — AB
pH, UA: 7 (ref 5.0–8.0)

## 2018-03-16 MED ORDER — ESCITALOPRAM OXALATE 10 MG PO TABS: 15.0000 mg | ORAL_TABLET | Freq: Every day | ORAL | 2 refills | Status: DC

## 2018-03-16 NOTE — Progress Notes (Signed)
THIS RECORD MAY CONTAIN CONFIDENTIAL INFORMATION THAT SHOULD NOT BE RELEASED WITHOUT REVIEW OF THE SERVICE PROVIDER.  Adolescent Medicine Consultation Follow-Up Visit Carolyn ServerLeah Smith  is a 22 y.o. female referred by Angelica ChessmanAguiar, Rafaela M, MD here today for follow-up regarding disordered eating, anxiety, bradycardia, and secondary amenorrhea.     Last seen in [facility] on 02/05/18 for same.  Plan at last visit included continuing with AN treatment team, scheduling bone density, and switching from prozac to 10mg  lexapro.  Pertinent Labs? Yes, UA wnl Growth Chart Viewed? yes   History was provided by the patient.  Interpreter? no  PCP Confirmed?  no  My Chart Activated?   no   Chief Complaint  Patient presents with  . Follow-up  . Eating Disorder    HPI:    Carolyn RuizLeah says that things are going the same for her. In terms of mood, she has not noticed any effects  after switching to Lexapro 10mg  from Prozac. She denies any side effects; she is sleeping well. Eating disorder thoughts still occupy significant amount of mental energy.   Meal plan: she is following plan from dietitian  Dietitian: every other week  Therapist: every other week Medication: lexapro 10mg  - Compliance: good - Side effects: denies - Benefits: none Activity level: walks her dog Sleep: fine Binge/purge: none Menstrual patterns: reports period in May after no period since 11/18  24 hour recall: B: Oatmeal, banana, english muffin S: ensure, yogurt covered raisins L: [place] sandwich-turkey, cheese, lettuce tomato; pretzels; carrot sticks S: granola bar, ensure D:chik-fil-a wrap, fruit cup S: yogurt, granola, bluerberries  LMP: 5/19.    Review of systems:  Headaches-denies Abdominal pain-denies Nausea/vomiting: denies Constipation: denies Diarrhea: denies Skin changes: denies Hair loss: denies Mood/anxiety: endorses anxiety  Other systems reviewed and negative except as per HPI.    No LMP  recorded. No Known Allergies Outpatient Medications Prior to Visit  Medication Sig Dispense Refill  . escitalopram (LEXAPRO) 10 MG tablet Take 1 tablet (10 mg total) by mouth daily. 30 tablet 2  . Multiple Vitamin (MULTIVITAMIN) tablet Take 1 tablet by mouth daily.    Marland Kitchen. sulfamethoxazole-trimethoprim (BACTRIM,SEPTRA) 400-80 MG tablet Take 1 tablet by mouth 2 (two) times daily.    Marland Kitchen. FLUoxetine (PROZAC) 20 MG capsule Take 1 capsule (20 mg total) by mouth daily. (Patient not taking: Reported on 03/16/2018) 30 capsule 3   No facility-administered medications prior to visit.      Patient Active Problem List   Diagnosis Date Noted  . Underweight 01/20/2018  . Bradycardia 01/20/2018  . Secondary amenorrhea 01/20/2018  . Migraine with aura 01/20/2018  . Adjustment disorder with anxiety 09/28/2014  . Eating disorder 03/08/2014    Social History:   Lifestyle habits that can impact QOL: Sleep: same Eating habits/patterns: as per dietitian  Exercise: walks dog   Confidentiality was discussed with the patient and if applicable, with caregiver as well.  Changes at home or school since last visit:  no  Partner preference?  female  Sexually Active?  yes  Pregnancy Prevention:  condoms Reviewed condoms:  yes Reviewed EC:  no   Suicidal or homicidal thoughts?   no Self injurious behaviors?  no    The following portions of the patient's history were reviewed and updated as appropriate: current medications, past medical history and past social history.  Physical Exam:  Vitals:   03/16/18 1348  BP: 105/71  Pulse: 64  Weight: 107 lb 12.8 oz (48.9 kg)  Height: 5' 6.93" (  1.7 m)   BP 105/71   Pulse 64   Ht 5' 6.93" (1.7 m)   Wt 107 lb 12.8 oz (48.9 kg)   BMI 16.92 kg/m  Body mass index: body mass index is 16.92 kg/m. Growth percentile SmartLinks can only be used for patients less than 70 years old.  Physical Exam  Constitutional: She appears well-developed. No distress.  Very  thin, ribs apparent    HENT:  Head: Normocephalic and atraumatic.  Eyes: Conjunctivae are normal.  Neck: Normal range of motion. Neck supple. No thyromegaly present.  Cardiovascular: Normal rate, regular rhythm and intact distal pulses.  No murmur heard. Pulmonary/Chest: Effort normal and breath sounds normal. She has no wheezes.  Musculoskeletal: Normal range of motion. She exhibits no edema.  Lymphadenopathy:    She has no cervical adenopathy.  Neurological: She is alert.  Skin: Skin is warm and dry. No rash noted.  Facial acne   Psychiatric: She has a normal mood and affect. Her behavior is normal.    Assessment/Plan:  1. Anorexia nervosa, restricting type. Patient is down ~3lbs from last visit 5 weeks ago. Vital signs are stable. No need for inpatient hospitalization at this time. Still continuing to experience significant eating disorder thoughts.  - Continue therapy  - Continue dietitian  - Follow up in 4 weeks  2. Adjustment disorder w/ anxiety - Will increase Lexapro to 20mg .  - Discussed importance of eating enough for medication to be effective  3. Secondary amenorrhea, now resolved-likely 2/2 low weight - DEXA ordered, scheduled in July - Discussed importance of using contraception; patient is considering an IUD  BH screenings: none today.   Follow-up: 4 weeks.   Medical decision-making:  >20 minutes spent face to face with patient with more than 50% of appointment spent discussing diagnosis, management, follow-up, and reviewing of GAD, AN.    Hessie Knows, MS4

## 2018-03-16 NOTE — Patient Instructions (Signed)
We will increase your lexapro to 20mg  today. In order for the medicine to be effective, it is really important to follow the dietician's plan to the best of your ability. We recommend that you continue seeing your therapist and dietician as well.   Please follow up as scheduled in 1 month.

## 2018-03-17 DIAGNOSIS — L42 Pityriasis rosea: Secondary | ICD-10-CM | POA: Diagnosis not present

## 2018-03-20 DIAGNOSIS — F4325 Adjustment disorder with mixed disturbance of emotions and conduct: Secondary | ICD-10-CM | POA: Diagnosis not present

## 2018-03-23 DIAGNOSIS — F5000 Anorexia nervosa, unspecified: Secondary | ICD-10-CM | POA: Diagnosis not present

## 2018-03-27 DIAGNOSIS — F4325 Adjustment disorder with mixed disturbance of emotions and conduct: Secondary | ICD-10-CM | POA: Diagnosis not present

## 2018-03-31 DIAGNOSIS — F5000 Anorexia nervosa, unspecified: Secondary | ICD-10-CM | POA: Diagnosis not present

## 2018-04-14 ENCOUNTER — Ambulatory Visit
Admission: RE | Admit: 2018-04-14 | Discharge: 2018-04-14 | Disposition: A | Discharge status: Home / Self Care | Payer: BLUE CROSS/BLUE SHIELD | Source: Ambulatory Visit | Attending: Pediatrics | Admitting: Pediatrics

## 2018-04-14 DIAGNOSIS — M810 Age-related osteoporosis without current pathological fracture: Secondary | ICD-10-CM | POA: Diagnosis not present

## 2018-04-14 DIAGNOSIS — M85852 Other specified disorders of bone density and structure, left thigh: Secondary | ICD-10-CM | POA: Diagnosis not present

## 2018-04-14 DIAGNOSIS — Z780 Asymptomatic menopausal state: Secondary | ICD-10-CM | POA: Diagnosis not present

## 2018-04-14 DIAGNOSIS — M85851 Other specified disorders of bone density and structure, right thigh: Secondary | ICD-10-CM | POA: Diagnosis not present

## 2018-04-15 ENCOUNTER — Other Ambulatory Visit: Payer: Self-pay | Admitting: Pediatrics

## 2018-04-15 DIAGNOSIS — F5000 Anorexia nervosa, unspecified: Secondary | ICD-10-CM | POA: Diagnosis not present

## 2018-04-15 DIAGNOSIS — M818 Other osteoporosis without current pathological fracture: Secondary | ICD-10-CM

## 2018-04-15 MED ORDER — PROGESTERONE MICRONIZED 200 MG PO CAPS: ORAL_CAPSULE | ORAL | 6 refills | Status: DC

## 2018-04-15 MED ORDER — ESTRADIOL 0.1 MG/24HR TD PTWK: 0.1000 mg | MEDICATED_PATCH | TRANSDERMAL | 12 refills | Status: DC

## 2018-04-20 ENCOUNTER — Ambulatory Visit: Payer: BLUE CROSS/BLUE SHIELD | Admitting: Pediatrics

## 2018-04-20 ENCOUNTER — Ambulatory Visit (INDEPENDENT_AMBULATORY_CARE_PROVIDER_SITE_OTHER): Payer: BLUE CROSS/BLUE SHIELD | Admitting: Pediatrics

## 2018-04-20 ENCOUNTER — Encounter: Payer: Self-pay | Admitting: Pediatrics

## 2018-04-20 VITALS — BP 102/62 | HR 59 | Ht 67.0 in | Wt 107.6 lb

## 2018-04-20 DIAGNOSIS — F509 Eating disorder, unspecified: Secondary | ICD-10-CM | POA: Diagnosis not present

## 2018-04-20 DIAGNOSIS — G43109 Migraine with aura, not intractable, without status migrainosus: Secondary | ICD-10-CM

## 2018-04-20 DIAGNOSIS — E440 Moderate protein-calorie malnutrition: Secondary | ICD-10-CM

## 2018-04-20 DIAGNOSIS — F4322 Adjustment disorder with anxiety: Secondary | ICD-10-CM

## 2018-04-20 DIAGNOSIS — N911 Secondary amenorrhea: Secondary | ICD-10-CM

## 2018-04-20 DIAGNOSIS — Z1389 Encounter for screening for other disorder: Secondary | ICD-10-CM

## 2018-04-20 DIAGNOSIS — M818 Other osteoporosis without current pathological fracture: Secondary | ICD-10-CM

## 2018-04-20 LAB — POCT URINALYSIS DIPSTICK
Bilirubin, UA: NEGATIVE
Blood, UA: NEGATIVE
GLUCOSE UA: NEGATIVE
Ketones, UA: NEGATIVE
Nitrite, UA: NEGATIVE
Protein, UA: NEGATIVE
Spec Grav, UA: 1.01 (ref 1.010–1.025)
Urobilinogen, UA: 1 E.U./dL
pH, UA: 7 (ref 5.0–8.0)

## 2018-04-20 NOTE — Patient Instructions (Addendum)
[email or web address]  [email or web address]  Calcium and vitamin D with vitamin K  Wait until tomorrow to start patches and progesterone

## 2018-04-20 NOTE — Progress Notes (Signed)
THIS RECORD MAY CONTAIN CONFIDENTIAL INFORMATION THAT SHOULD NOT BE RELEASED WITHOUT REVIEW OF THE SERVICE PROVIDER.  Adolescent Medicine Consultation Follow-Up Visit Carolyn Smith  is a 22 y.o. female referred by Carolyn Mcburney, FNP here today for follow-up regarding anorexia, secondary amenorrhea, moderate malnutrition, anxiety.    Last seen in [facility] on 03/16/18 for the above.  Plan at last visit included continue lexapro, continue with treatment team.  Pertinent Labs? Yes, bone density screening completed last week- consistent with significant osteoporosis (z score -3.3) in the setting of relapsed anorexia and secondary amenorrhea   Growth Chart Viewed? yes   History was provided by the patient.  Interpreter? no  PCP Confirmed?  yes  My Chart Activated?   yes   Chief Complaint  Patient presents with  . Follow-up  . Eating Disorder    HPI:    #eating disorder:   DE voice 6-7, anxiety 5. Feels that the anxiety is fairly manageable. She meets with Carolyn Smith and often agrees on ways that she can increase her intake at the visit and then gets home and realizes it is too overwhelming and difficult and does not follow through on the recommendations. She has not considered the things that would make her think it is time for a higher level of care, however, she and Carolyn Smith have talked about an IOP in the past. 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   #secondary amenorrhea:   Had a period toward the end of May and beginning of July. Periods lasted 8 days. She feels reassured that this means she is improving, however, recognizes that she is still quite sick. Does seem tearful when we are discussing  Thinks it was about 9 months without period in this episode of her eating disorder. Previously she had secondary amenorrhea in 2015 when her lowest adult weight was 94 pounds.   #migraine with aura:   Has had migraine with aura twice a year. October 2018 was the  last one. She does not require abortive therapy and typically just goes to sleep and wakes up and feels better. Has never had LOC, weakness or other sensory disturbances. Does not remember ever having seen neurology.   #osteoporosis:  Denies any bone pain at this time. She is not doing any high impact activities and typically is just walking her dog for 10-15 minutes a day. She is not currently on a calcium or vitamin D supplement.   [email or web address]' .[email or web address]    Review of Systems  Constitutional: Negative for malaise/fatigue.  Eyes: Negative for double vision.  Respiratory: Negative for shortness of breath.   Cardiovascular: Negative for chest pain and palpitations.  Gastrointestinal: Negative for abdominal pain, constipation, diarrhea, nausea and vomiting.  Genitourinary: Negative for dysuria.  Musculoskeletal: Negative for joint pain and myalgias.  Skin: Negative for rash.  Neurological: Negative for dizziness and headaches.  Endo/Heme/Allergies: Does not bruise/bleed easily.  Psychiatric/Behavioral: Negative for depression. The patient is nervous/anxious.      Patient's last menstrual period was 04/11/2018 (exact date). No Known Allergies Outpatient Medications Prior to Visit  Medication Sig Dispense Refill  . escitalopram (LEXAPRO) 10 MG tablet Take 1.5 tablets (15 mg total) by mouth daily. 45 tablet 2  . estradiol (CLIMARA - DOSED IN MG/24 HR) 0.1 mg/24hr patch Place 1 patch (0.1 mg total) onto the skin once a week. 4 patch 12  . Multiple Vitamin (MULTIVITAMIN) tablet Take 1 tablet by mouth daily.    . progesterone ([place])  200 MG capsule Take 1 capsule daily in 12 day cycles. Stop for 16 days for a total 28 day cycle. Repeat monthly. 12 capsule 6  . sulfamethoxazole-trimethoprim (BACTRIM,SEPTRA) 400-80 MG tablet Take 1 tablet by mouth 2 (two) times daily.     No facility-administered medications prior to visit.      Patient Active Problem List   Diagnosis Date Noted  .  Moderate protein-calorie malnutrition ([place]) 03/16/2018  . Underweight 01/20/2018  . Bradycardia 01/20/2018  . Secondary amenorrhea 01/20/2018  . Migraine with aura 01/20/2018  . Adjustment disorder with anxiety 09/28/2014  . Eating disorder 03/08/2014    The following portions of the patient's history were reviewed and updated as appropriate: allergies, current medications, past family history, past medical history, past social history, past surgical history and problem list.  Physical Exam:  Vitals:   04/20/18 1524  BP: 102/62  Pulse: (!) 59  Weight: 107 lb 9.6 oz (48.8 kg)  Height: '5\' 7"'  (1.702 m)   BP 102/62   Pulse (!) 59   Ht '5\' 7"'  (1.702 m)   Wt 107 lb 9.6 oz (48.8 kg)   LMP 04/11/2018 (Exact Date)   BMI 16.85 kg/m  Body mass index: body mass index is 16.85 kg/m. Growth percentile SmartLinks can only be used for patients less than 20 years old.   Physical Exam  Constitutional: She appears well-developed. No distress.  Very thin female in NAD  HENT:  Mouth/Throat: Oropharynx is clear and moist.  Neck: No thyromegaly present.  Cardiovascular: Normal rate and regular rhythm.  No murmur heard. Pulmonary/Chest: Breath sounds normal.  Abdominal: Soft. She exhibits no mass. There is no tenderness. There is no guarding.  Musculoskeletal: She exhibits no edema.  Lymphadenopathy:    She has no cervical adenopathy.  Neurological: She is alert.  Skin: Skin is warm. Capillary refill takes 2 to 3 seconds. No rash noted.  Psychiatric: Her mood appears anxious.  Nursing note and vitals reviewed.   Assessment/Plan: 1. Moderate protein-calorie malnutrition (HCC) Continues to slowly drop weight over time. Has not had any weight restoration since we met, and is in fact down about 4 pounds overall. Discussed the possibility of doing an in person or virtual IOP today given the continued severity of her illness and the obvious impact this is having on her body, particularly her  bone health.   2. Adjustment disorder with anxiety Continues on lexapro 15 mg daily. Could consider increase vs. Addition of second agent if needed in the future. Continues with therapist.   3. Secondary amenorrhea Has now had two cycles, but they were fairly spaced out. Given her continued significant underweight status, I don't expect she will continue to cycle regularly but we have talked about the risks of pregnancy as well. This is contributing significantly to her poor bone health. We discussed estradiol replacement and reviewed the literature, however, given her migraine with aura, it was felt by Dr. Henrene Pastor that it was best to refer her to endocrinology for further assistance in selecting treatment for osteoporosis.   4. Migraine with aura and without status migrainosus, not intractable Only has about 2 a year, but introducing a large amount of estrogen has theoretical increased stroke risk.   5. Other osteoporosis without current pathological fracture Will refer to adult endo at [facility] for further assistance. Appreciate their input. Patient is in agreement. Labs per endo's request drawn today and will send along with bone density report.   6. Screening for genitourinary condition Normal.  -  POCT urinalysis dipstick  Follow-up:  1 month   Medical decision-making:  >25 minutes spent face to face with patient with more than 50% of appointment spent discussing diagnosis, management, follow-up, and reviewing of anorexia, anxiety, migraine, osteoporosis, anorexia, amenorrhea.

## 2018-04-21 ENCOUNTER — Ambulatory Visit: Payer: BLUE CROSS/BLUE SHIELD

## 2018-04-21 ENCOUNTER — Other Ambulatory Visit: Payer: Self-pay | Admitting: Pediatrics

## 2018-04-21 DIAGNOSIS — E440 Moderate protein-calorie malnutrition: Secondary | ICD-10-CM | POA: Diagnosis not present

## 2018-04-21 DIAGNOSIS — F5000 Anorexia nervosa, unspecified: Secondary | ICD-10-CM

## 2018-04-21 NOTE — Progress Notes (Unsigned)
Patient came in for labs CMP and Vitamin D. Labs ordered by Alfonso Ramusaroline Hacker NP. Successful collection.

## 2018-04-22 DIAGNOSIS — M818 Other osteoporosis without current pathological fracture: Secondary | ICD-10-CM | POA: Insufficient documentation

## 2018-04-22 DIAGNOSIS — F4325 Adjustment disorder with mixed disturbance of emotions and conduct: Secondary | ICD-10-CM | POA: Diagnosis not present

## 2018-04-22 LAB — COMPREHENSIVE METABOLIC PANEL
AG Ratio: 2.3 (calc) (ref 1.0–2.5)
ALBUMIN MSPROF: 4.5 g/dL (ref 3.6–5.1)
ALT: 32 U/L — AB (ref 6–29)
AST: 36 U/L — ABNORMAL HIGH (ref 10–30)
Alkaline phosphatase (APISO): 56 U/L (ref 33–115)
BUN: 15 mg/dL (ref 7–25)
CO2: 27 mmol/L (ref 20–32)
CREATININE: 0.86 mg/dL (ref 0.50–1.10)
Calcium: 9.6 mg/dL (ref 8.6–10.2)
Chloride: 103 mmol/L (ref 98–110)
GLUCOSE: 82 mg/dL (ref 65–99)
Globulin: 2 g/dL (calc) (ref 1.9–3.7)
POTASSIUM: 4.4 mmol/L (ref 3.5–5.3)
SODIUM: 137 mmol/L (ref 135–146)
TOTAL PROTEIN: 6.5 g/dL (ref 6.1–8.1)
Total Bilirubin: 0.4 mg/dL (ref 0.2–1.2)

## 2018-04-22 LAB — VITAMIN D 25 HYDROXY (VIT D DEFICIENCY, FRACTURES): Vit D, 25-Hydroxy: 48 ng/mL (ref 30–100)

## 2018-04-27 DIAGNOSIS — Z713 Dietary counseling and surveillance: Secondary | ICD-10-CM | POA: Diagnosis not present

## 2018-05-06 DIAGNOSIS — F5000 Anorexia nervosa, unspecified: Secondary | ICD-10-CM | POA: Diagnosis not present

## 2018-05-08 DIAGNOSIS — F4325 Adjustment disorder with mixed disturbance of emotions and conduct: Secondary | ICD-10-CM | POA: Diagnosis not present

## 2018-05-11 DIAGNOSIS — F5000 Anorexia nervosa, unspecified: Secondary | ICD-10-CM | POA: Diagnosis not present

## 2018-05-21 ENCOUNTER — Encounter: Payer: Self-pay | Admitting: Pediatrics

## 2018-05-21 ENCOUNTER — Ambulatory Visit (INDEPENDENT_AMBULATORY_CARE_PROVIDER_SITE_OTHER): Payer: BLUE CROSS/BLUE SHIELD | Admitting: Pediatrics

## 2018-05-21 VITALS — BP 94/62 | HR 61 | Ht 67.0 in | Wt 108.0 lb

## 2018-05-21 DIAGNOSIS — Z1389 Encounter for screening for other disorder: Secondary | ICD-10-CM

## 2018-05-21 DIAGNOSIS — F4322 Adjustment disorder with anxiety: Secondary | ICD-10-CM

## 2018-05-21 DIAGNOSIS — N911 Secondary amenorrhea: Secondary | ICD-10-CM

## 2018-05-21 DIAGNOSIS — E440 Moderate protein-calorie malnutrition: Secondary | ICD-10-CM | POA: Diagnosis not present

## 2018-05-21 DIAGNOSIS — F4325 Adjustment disorder with mixed disturbance of emotions and conduct: Secondary | ICD-10-CM | POA: Diagnosis not present

## 2018-05-21 DIAGNOSIS — M818 Other osteoporosis without current pathological fracture: Secondary | ICD-10-CM

## 2018-05-21 DIAGNOSIS — F5000 Anorexia nervosa, unspecified: Secondary | ICD-10-CM

## 2018-05-21 LAB — POCT URINALYSIS DIPSTICK
Bilirubin, UA: NEGATIVE
Glucose, UA: NEGATIVE
Ketones, UA: NEGATIVE
Nitrite, UA: NEGATIVE
PH UA: 6.5 (ref 5.0–8.0)
Protein, UA: NEGATIVE
RBC UA: NEGATIVE
Spec Grav, UA: 1.015 (ref 1.010–1.025)
UROBILINOGEN UA: 1 U/dL

## 2018-05-21 NOTE — Progress Notes (Signed)
THIS RECORD MAY CONTAIN CONFIDENTIAL INFORMATION THAT SHOULD NOT BE RELEASED WITHOUT REVIEW OF THE SERVICE PROVIDER.  Adolescent Medicine Consultation Follow-Up Visit Carolyn Smith  is a 22 y.o. female referred by Verneda SkillHacker, Caroline T, FNP here today for follow-up regarding anorexia with moderate protein-calorie malnutrition and secondary amenorrhea and anxiety.  Last seen in [facility] on 04/20/18 for the above.  Plan at last visit included introduction of higher level of care plans to the patient  - Pertinent Labs? Yes - Growth Chart Viewed? not applicable   History was provided by the patient.  PCP Confirmed?  yes  My Chart Activated?   yes   Chief Complaint  Patient presents with  . Follow-up  . Eating Disorder    W/O EVS     HPI:    Eating Disorder Patient feels that things are going pretty well. She does not feel that the DE voice is strong but also acknowledges that she plans to make a change but then decides that she can start the change tomorrow or just do it a few days a week; more meals than not, she is going through that thought process (especially at lunch and dinner). She has not had a period since July 2019.   Since her last visit, [name] has given more thought to IOP options. She thinks that online IOP would be better, but is disappointed that she has to make a change to her treatment plan. We also discussed Zyprexa, including side effects.  [name] describes that family are aware of her disordered eating and were more involved when she was living with them, but have not been as actively involved that it is an active problem since she moved out. Her boyfriend is also aware and will intermittently have conversations about it but describes it as an "elephant in the room". She acknowledges that she could utilize these supports more, but she does not want to ask for that help. She IDs her boyfriend and father as two supports she can engage first.  Anxiety Chavon  reports that her anxiety is well controlled on her current dose of Lexapro.  Osteoporosis [name] reports that she has an [facility] in [facility] weeks.   No LMP recorded. No Known Allergies Outpatient Medications Prior to Visit  Medication Sig Dispense Refill  . escitalopram (LEXAPRO) 10 MG tablet Take 1.5 tablets (15 mg total) by mouth daily. 45 tablet 2  . Multiple Vitamin (MULTIVITAMIN) tablet Take 1 tablet by mouth daily.    Marland Kitchen. sulfamethoxazole-trimethoprim (BACTRIM,SEPTRA) 400-80 MG tablet Take 1 tablet by mouth 2 (two) times daily.     No facility-administered medications prior to visit.      Patient Active Problem List   Diagnosis Date Noted  . Other osteoporosis without current pathological fracture 04/22/2018  . Moderate protein-calorie malnutrition (HCC) 03/16/2018  . Underweight 01/20/2018  . Bradycardia 01/20/2018  . Secondary amenorrhea 01/20/2018  . Migraine with aura 01/20/2018  . Adjustment disorder with anxiety 09/28/2014  . Eating disorder 03/08/2014    The following portions of the patient's history were reviewed and updated as appropriate: allergies, current medications, past family history, past medical history, past social history, past surgical history and problem list.  Physical Exam:  Vitals:   05/21/18 0851  BP: 94/62  Pulse: 61  Weight: 108 lb (49 kg)  Height: 5\' 7"  (1.702 m)   BP 94/62   Pulse 61   Ht 5\' 7"  (1.702 m)   Wt 108  lb (49 kg)   BMI 16.92 kg/m  Body mass index: body mass index is 16.92 kg/m. Growth percentile SmartLinks can only be used for patients less than 69 years old.  Physical Exam General: cachectic young female adult, in NAD HEENT: [place]/AT, PERRL, EOMI, no conjunctival injection, mucous membranes moist, oropharynx clear Neck: full ROM, supple Lymph nodes: no cervical lymphadenopathy Chest: lungs CTAB, no nasal flaring or grunting, no increased work of breathing, no retractions Heart: slight  bradycardic, RR, no m/r/g Abdomen: soft, nontender, nondistended, no hepatosplenomegaly Extremities: Cap refill <3s Musculoskeletal: full ROM in 4 extremities, moves all extremities equally Neurological: alert and active Skin: no rash   Assessment/Plan:  1. Moderate protein-calorie malnutrition - patient with roughly stable weight today with continued bradycardia and concerns for secondary osteoporosis and amenorrhea. Given her lack of progress, will require change to treatment plan today, either through IOP or addition of Zyprexa - Gave patient information sheet about Zyprexa - Discussed IOP options again - Introduced idea of [address] app - Set agreement with patient that she re-engage her parents and boyfriend as a support system and discuss medication vs. IOP with her therapist and nutritionist - Return in 2 weeks  2. Adjustment disorder with anxiety - Continue Lexapro 15 mg daily - Continue with therapist Marylene Land Wiley)  3. Secondary Osteoporosis - Patient to see [facility] adult endocrinology in 2 weeks  Follow-up:  Return in about 2 weeks (around 06/04/2018).   Medical decision-making:  >25 minutes spent face to face with patient with more than 50% of appointment spent discussing diagnosis, management, follow-up, and reviewing of anorexia, secondary amenorrhea and osteoporosis, and anxiety.

## 2018-06-03 ENCOUNTER — Other Ambulatory Visit: Payer: Self-pay | Admitting: Family

## 2018-06-03 ENCOUNTER — Encounter: Payer: Self-pay | Admitting: Family

## 2018-06-03 DIAGNOSIS — Z713 Dietary counseling and surveillance: Secondary | ICD-10-CM | POA: Diagnosis not present

## 2018-06-03 MED ORDER — OLANZAPINE 2.5 MG PO TABS: 2.5000 mg | ORAL_TABLET | Freq: Every day | ORAL | 0 refills | Status: DC

## 2018-06-04 DIAGNOSIS — M8580 Other specified disorders of bone density and structure, unspecified site: Secondary | ICD-10-CM | POA: Insufficient documentation

## 2018-06-04 DIAGNOSIS — M859 Disorder of bone density and structure, unspecified: Secondary | ICD-10-CM | POA: Insufficient documentation

## 2018-06-05 DIAGNOSIS — M810 Age-related osteoporosis without current pathological fracture: Secondary | ICD-10-CM | POA: Diagnosis not present

## 2018-06-05 DIAGNOSIS — R630 Anorexia: Secondary | ICD-10-CM | POA: Diagnosis not present

## 2018-06-05 DIAGNOSIS — F419 Anxiety disorder, unspecified: Secondary | ICD-10-CM | POA: Diagnosis not present

## 2018-06-05 DIAGNOSIS — F4325 Adjustment disorder with mixed disturbance of emotions and conduct: Secondary | ICD-10-CM | POA: Diagnosis not present

## 2018-06-05 DIAGNOSIS — N912 Amenorrhea, unspecified: Secondary | ICD-10-CM | POA: Diagnosis not present

## 2018-06-05 DIAGNOSIS — M8580 Other specified disorders of bone density and structure, unspecified site: Secondary | ICD-10-CM | POA: Diagnosis not present

## 2018-06-05 DIAGNOSIS — Z681 Body mass index (BMI) 19 or less, adult: Secondary | ICD-10-CM | POA: Diagnosis not present

## 2018-06-08 DIAGNOSIS — F5000 Anorexia nervosa, unspecified: Secondary | ICD-10-CM | POA: Diagnosis not present

## 2018-06-12 DIAGNOSIS — F4325 Adjustment disorder with mixed disturbance of emotions and conduct: Secondary | ICD-10-CM | POA: Diagnosis not present

## 2018-06-15 DIAGNOSIS — F5000 Anorexia nervosa, unspecified: Secondary | ICD-10-CM | POA: Diagnosis not present

## 2018-06-15 DIAGNOSIS — F4325 Adjustment disorder with mixed disturbance of emotions and conduct: Secondary | ICD-10-CM | POA: Diagnosis not present

## 2018-06-22 ENCOUNTER — Encounter: Payer: Self-pay | Admitting: Pediatrics

## 2018-06-22 ENCOUNTER — Other Ambulatory Visit: Payer: Self-pay

## 2018-06-22 ENCOUNTER — Ambulatory Visit (INDEPENDENT_AMBULATORY_CARE_PROVIDER_SITE_OTHER): Payer: BLUE CROSS/BLUE SHIELD | Admitting: Pediatrics

## 2018-06-22 VITALS — BP 103/58 | HR 62 | Ht 66.93 in | Wt 108.8 lb

## 2018-06-22 DIAGNOSIS — F5000 Anorexia nervosa, unspecified: Secondary | ICD-10-CM

## 2018-06-22 DIAGNOSIS — N911 Secondary amenorrhea: Secondary | ICD-10-CM

## 2018-06-22 DIAGNOSIS — M818 Other osteoporosis without current pathological fracture: Secondary | ICD-10-CM | POA: Diagnosis not present

## 2018-06-22 DIAGNOSIS — Z1389 Encounter for screening for other disorder: Secondary | ICD-10-CM | POA: Diagnosis not present

## 2018-06-22 DIAGNOSIS — F4322 Adjustment disorder with anxiety: Secondary | ICD-10-CM

## 2018-06-22 DIAGNOSIS — E440 Moderate protein-calorie malnutrition: Secondary | ICD-10-CM

## 2018-06-22 LAB — POCT URINALYSIS DIPSTICK
Bilirubin, UA: NEGATIVE
Blood, UA: NEGATIVE
GLUCOSE UA: NEGATIVE
Ketones, UA: NEGATIVE
Nitrite, UA: NEGATIVE
Protein, UA: NEGATIVE
Spec Grav, UA: 1.01 (ref 1.010–1.025)
Urobilinogen, UA: NEGATIVE E.U./dL — AB
pH, UA: 7 (ref 5.0–8.0)

## 2018-06-22 MED ORDER — OLANZAPINE 5 MG PO TABS: 5.0000 mg | ORAL_TABLET | Freq: Every day | ORAL | 1 refills | Status: DC

## 2018-06-22 NOTE — Progress Notes (Signed)
THIS RECORD MAY CONTAIN CONFIDENTIAL INFORMATION THAT SHOULD NOT BE RELEASED WITHOUT REVIEW OF THE SERVICE PROVIDER.  Adolescent Medicine Consultation Follow-Up Visit Carolyn Smith  is a 22 y.o. female referred by Verneda SkillHacker, Caroline T, FNP here today for follow-up regarding anorexia with moderate protein-calorie malnutrition and secondary amenorrhea, as well as anxiety.    Last seen in [facility] on 05/21/18 for the above.  Plan at last visit included starting Zyprexa, discussing IOP options and the [facility] app, and engaging patient to agree to seek her boyfriend as a formal support for worsening anorexia. No changes were made to anxiety medication: Lexapro 15 mg daily and therapy. Lastly, the patient was to [facility] for her scans consistent with osteoporosis.   - Pertinent Labs? Yes - Growth Chart Viewed? Yes - has gained 12 oz since last visit   History was provided by the patient.  PCP Confirmed?  yes  Chief Complaint  Patient presents with  . Follow-up  . Medication Management    HPI:    Anorexia Nervosa with Moderate Protein-Calorie Malnutrition Today, patient reports that things are "about the same."  Meals are going "alright" - she is limiting herself to normal safe foods. She tried to challenge herself by going out to eat with friends intermittently. She spoke with her boyfriend about how things were going and shared more details than she had before. He has been supportive and wants to help. She gave him ideas about trying to have them eat the same thing when they eat together or eat out together but they have not tried one of those challenges. She reports that he is not used to challenging her and so they are both trying to stay comfortable. She is going to have him go with her to her next nutrition appointment.   Today, patient reports that [facility].  She reports that they reiterated her bone density is very low but  that they would not recommend prescription treatment. They emphasized that continuing to nourish herself is the best treatment but she could take calcium and vitamin D supplements  She also downloaded the [facility] app and invited her dietician to share it with her. She has not logged anything and is not sure whether it would be helpful. She discussed it with her dietician who talked about the possibility of some clients becoming "obsessed" with it and they did not come to a decision. She is worried that this would give her something else to be anxious about. She continues to want to avoid IOP.   She has been taking the Zyprexa. She discussed it with her dietician, who was supportive of trying it. It has now been 2-3 weeks. She thinks that maybe she is noticed a little less worrying about food but that it has not made a huge difference in what she is doing. She denies any side effects, including sedation.   She just a period this past week but reports that periods are continuing to be irregular (roughly every 5 weeks).   Anxiety She reports mood has been the same as normal.    No LMP recorded. No Known Allergies Outpatient Medications Prior to Visit  Medication Sig Dispense Refill  . Multiple Vitamin (MULTIVITAMIN) tablet Take 1 tablet by mouth daily.    Marland Kitchen. OLANZapine (ZYPREXA) 2.5 MG tablet Take 1 tablet (2.5 mg total) by mouth at bedtime. 30 tablet 0  . sulfamethoxazole-trimethoprim (BACTRIM,SEPTRA) 400-80 MG tablet Take 1 tablet  by mouth 2 (two) times daily.    Marland Kitchen escitalopram (LEXAPRO) 10 MG tablet Take 1.5 tablets (15 mg total) by mouth daily. 45 tablet 2   No facility-administered medications prior to visit.      Patient Active Problem List   Diagnosis Date Noted  . Low bone density for age 34/01/2018  . Other osteoporosis without current pathological fracture 04/22/2018  . Moderate protein-calorie malnutrition (HCC) 03/16/2018  . Underweight 01/20/2018  . Bradycardia 01/20/2018   . Secondary amenorrhea 01/20/2018  . Migraine with aura 01/20/2018  . Adjustment disorder with anxiety 09/28/2014  . Eating disorder 03/08/2014     The following portions of the patient's history were reviewed and updated as appropriate: allergies, current medications, past medical history, past social history and problem list.  Physical Exam:  Vitals:   06/22/18 1413  BP: (!) 103/58  Pulse: 62  Weight: 108 lb 12.8 oz (49.4 kg)  Height: 5' 6.93" (1.7 m)   BP (!) 103/58 (BP Location: Right Arm, Patient Position: Sitting, Cuff Size: Small)   Pulse 62   Ht 5' 6.93" (1.7 m)   Wt 108 lb 12.8 oz (49.4 kg)   BMI 17.08 kg/m  Body mass index: body mass index is 17.08 kg/m. Growth percentile SmartLinks can only be used for patients less than 64 years old.  Physical Exam General: cachetic female young adult in NAD HEENT: [place]/AT, PERRL, EOMI, no conjunctival injection, mucous membranes moist, oropharynx clear Neck: full ROM, supple Lymph nodes: no cervical lymphadenopathy Chest: lungs CTAB, no nasal flaring or grunting, no increased work of breathing, no retractions Heart: RRR, no m/r/g Abdomen: soft, nontender, nondistended, no hepatosplenomegaly Extremities: Cap refill <3s Musculoskeletal: full ROM in 4 extremities, moves all extremities equally Neurological: alert and active Skin: no rash    Assessment/Plan:  Anorexia Nervosa with Moderate Protein-Calorie Malnutrition - Increase Zyprexa to 5 mg QHS today - Encouraged Caroll to give her boyfriend permission to hold her accountable and encourage her to increase his education level by taking him to dietician appointment - Praised her small steps forward in recovery; will continue to check in with her about IOP and other options for support - Return in 1 month  Secondary Osteoporosis - patient with recently completed referral to [facility] endocrinology - Per [facility] endocrinology, return in 2 years for repeat bone scan - Continue calcium  and vitamin D supplements  History of Secondary Amenorrhea - patient with recently improved periods, though still irregular - Counseled on importance of not getting pregnant in setting of recently improved period frequency  Adjustment Disorder - patient reporting stable level of anxiety - Continue Lexapro 15 mg daily  Follow-up:  Return in about 1 month (around 07/22/2018) for follow up.   Medical decision-making:  >25 minutes spent face to face with patient with more than 50% of appointment spent discussing diagnosis, management, follow-up, and reviewing of ano.

## 2018-06-22 NOTE — Patient Instructions (Addendum)
We will increase your Zyprexa to 5 mg because it is working a little bit but not as well as you would like  We recommend that you continue to encourage your boyfriend to feel safe to challenge you. Bringing him to a nutritionist appointment will help with this  Return in 1 month

## 2018-06-29 DIAGNOSIS — F5001 Anorexia nervosa, restricting type: Secondary | ICD-10-CM | POA: Diagnosis not present

## 2018-07-07 DIAGNOSIS — F5000 Anorexia nervosa, unspecified: Secondary | ICD-10-CM | POA: Diagnosis not present

## 2018-07-23 ENCOUNTER — Encounter: Payer: Self-pay | Admitting: Pediatrics

## 2018-07-23 ENCOUNTER — Ambulatory Visit (INDEPENDENT_AMBULATORY_CARE_PROVIDER_SITE_OTHER): Payer: BLUE CROSS/BLUE SHIELD | Admitting: Pediatrics

## 2018-07-23 VITALS — BP 101/60 | HR 85 | Ht 67.13 in | Wt 110.0 lb

## 2018-07-23 DIAGNOSIS — F411 Generalized anxiety disorder: Secondary | ICD-10-CM | POA: Diagnosis not present

## 2018-07-23 DIAGNOSIS — E440 Moderate protein-calorie malnutrition: Secondary | ICD-10-CM

## 2018-07-23 DIAGNOSIS — F4322 Adjustment disorder with anxiety: Secondary | ICD-10-CM

## 2018-07-23 DIAGNOSIS — Z1389 Encounter for screening for other disorder: Secondary | ICD-10-CM

## 2018-07-23 DIAGNOSIS — M818 Other osteoporosis without current pathological fracture: Secondary | ICD-10-CM

## 2018-07-23 DIAGNOSIS — F5000 Anorexia nervosa, unspecified: Secondary | ICD-10-CM

## 2018-07-23 DIAGNOSIS — N911 Secondary amenorrhea: Secondary | ICD-10-CM | POA: Diagnosis not present

## 2018-07-23 LAB — POCT URINALYSIS DIPSTICK
BILIRUBIN UA: NEGATIVE
Blood, UA: NEGATIVE
Glucose, UA: NEGATIVE
Ketones, UA: NEGATIVE
Leukocytes, UA: NEGATIVE
Nitrite, UA: NEGATIVE
PH UA: 5 (ref 5.0–8.0)
Protein, UA: NEGATIVE
Spec Grav, UA: 1.015 (ref 1.010–1.025)
UROBILINOGEN UA: NEGATIVE U/dL — AB

## 2018-07-23 NOTE — Progress Notes (Signed)
History was provided by the patient.  Carolyn Smith is a 22 y.o. female who is here for anorexia, MDD, anxiety. Carolyn Skill, FNP   HPI:   Carolyn Smith .[email or web address]   Ran out of lexapro- stopped taking it about 2 weeks ago and hasn't really felt any different without it. Anxiety still about a 5/10- would like to see some improvement. Open to changing medications and genesight today before we change meds given that she has not had good response to two SSRIs at this point. Feels that olanzapine is helping some and voice around food is more quiet. She has been talking to her boyfriend more openly about things and is still hoping that he will help challenge her more.   Therapist is out of town for the next 4 weeks- seeing dietitian weekly. Feels that she is ok with therapist gone- knows her resources here and with dietitian as needed.   Taking Ca and vit d, MVI.   Still walking dog.  Using condoms all the time. LMP was >1 month ago- has not had any regular consecutive cycles yet. Discussed contraception and pregnancy risk again today- she still feels ok just using condoms. Discussed plan B as backup if needed.   Patient's last menstrual period was 06/16/2018.  Review of Systems  Constitutional: Negative for malaise/fatigue.  Eyes: Negative for double vision.  Respiratory: Negative for shortness of breath.   Cardiovascular: Negative for chest pain and palpitations.  Gastrointestinal: Negative for abdominal pain, constipation, diarrhea, nausea and vomiting.  Genitourinary: Negative for dysuria.  Musculoskeletal: Negative for joint pain and myalgias.  Skin: Negative for rash.  Neurological: Negative for dizziness and headaches.  Endo/Heme/Allergies: Does not bruise/bleed easily.  Psychiatric/Behavioral: Negative for depression and suicidal ideas. The patient is nervous/anxious.     Patient Active Problem List   Diagnosis Date Noted  . Low bone density for age 85/01/2018  . Other  osteoporosis without current pathological fracture 04/22/2018  . Moderate protein-calorie malnutrition (HCC) 03/16/2018  . Underweight 01/20/2018  . Bradycardia 01/20/2018  . Secondary amenorrhea 01/20/2018  . Migraine with aura 01/20/2018  . Adjustment disorder with anxiety 09/28/2014  . Eating disorder 03/08/2014    Current Outpatient Medications on File Prior to Visit  Medication Sig Dispense Refill  . OLANZapine (ZYPREXA) 5 MG tablet Take 1 tablet (5 mg total) by mouth at bedtime. 30 tablet 1  . escitalopram (LEXAPRO) 10 MG tablet Take 1.5 tablets (15 mg total) by mouth daily. 45 tablet 2  . Multiple Vitamin (MULTIVITAMIN) tablet Take 1 tablet by mouth daily.    Marland Kitchen sulfamethoxazole-trimethoprim (BACTRIM,SEPTRA) 400-80 MG tablet Take 1 tablet by mouth 2 (two) times daily.     No current facility-administered medications on file prior to visit.     No Known Allergies   Physical Exam:    Vitals:   07/23/18 1340  BP: 101/60  Pulse: 85  Weight: 110 lb (49.9 kg)  Height: 5' 7.13" (1.705 m)    Growth percentile SmartLinks can only be used for patients less than 49 years old.  Physical Exam  Constitutional: She appears well-developed. No distress.  Thin  HENT:  Mouth/Throat: Oropharynx is clear and moist.  Eyes: Pupils are equal, round, and reactive to light.  Neck: No thyromegaly present.  Cardiovascular: Normal rate and regular rhythm.  No murmur heard. Pulmonary/Chest: Breath sounds normal.  Abdominal: Soft. She exhibits no mass. There is no tenderness. There is no guarding.  Musculoskeletal: She exhibits no edema.  Lymphadenopathy:  She has no cervical adenopathy.  Neurological: She is alert.  Skin: Skin is warm. Capillary refill takes less than 2 seconds. No rash noted.  Psychiatric: She has a normal mood and affect.  Nursing note and vitals reviewed.   Assessment/Plan: 1. Moderate protein-calorie malnutrition (HCC) Has made some progress with some weight  gain today which is the first time in some time for her. Carolyn Smith was happy but did acknowledge for her eating disorder that it can be hard to hear that.   2. Anorexia nervosa Continues with treatment team. Has had some weight gain. Denies any major compensatory behaviors. Has more support of her boyfriend which she feels has been overall very helpful.   3. Secondary amenorrhea Having irregular cycles now- should mean that she is getting more estrogen to her bones, however, we will not check again at this time. Discussed contraception and pregnancy risk again. Continues with condoms and plan B.   4. Other osteoporosis without current pathological fracture Discussed Ca and Vit D- recommended adding vit K2 to this (there are supplements with all in them) to help strengthen bones. Discussed taking MVI and Ca at different times of day.   5. GAD Will get genesight testing today as she has had seemingly poor responses to 2 SSRIs at this point. She was agreeable. May consider SSRI depending on testing vs. Moving to zoloft. Discussed that we will assess dose of zyprexa based on genesight testing too. Will make f/u appt based on what we do with medication- likely f/u in about 2 weeks.   5. Screening for genitourinary condition No abnormalities today.  - POCT urinalysis dipstick

## 2018-07-27 DIAGNOSIS — Z713 Dietary counseling and surveillance: Secondary | ICD-10-CM | POA: Diagnosis not present

## 2018-07-28 ENCOUNTER — Other Ambulatory Visit: Payer: Self-pay | Admitting: Pediatrics

## 2018-07-28 MED ORDER — VENLAFAXINE HCL ER 37.5 MG PO CP24: ORAL_CAPSULE | ORAL | 0 refills | Status: DC

## 2018-08-03 DIAGNOSIS — F5000 Anorexia nervosa, unspecified: Secondary | ICD-10-CM | POA: Diagnosis not present

## 2018-08-05 ENCOUNTER — Ambulatory Visit (INDEPENDENT_AMBULATORY_CARE_PROVIDER_SITE_OTHER): Payer: BLUE CROSS/BLUE SHIELD | Admitting: Pediatrics

## 2018-08-05 ENCOUNTER — Encounter: Payer: Self-pay | Admitting: Pediatrics

## 2018-08-05 VITALS — BP 103/67 | HR 67 | Ht 67.0 in | Wt 105.8 lb

## 2018-08-05 DIAGNOSIS — E440 Moderate protein-calorie malnutrition: Secondary | ICD-10-CM

## 2018-08-05 DIAGNOSIS — Z1389 Encounter for screening for other disorder: Secondary | ICD-10-CM | POA: Diagnosis not present

## 2018-08-05 DIAGNOSIS — N911 Secondary amenorrhea: Secondary | ICD-10-CM | POA: Diagnosis not present

## 2018-08-05 DIAGNOSIS — F5000 Anorexia nervosa, unspecified: Secondary | ICD-10-CM

## 2018-08-05 DIAGNOSIS — F4322 Adjustment disorder with anxiety: Secondary | ICD-10-CM | POA: Diagnosis not present

## 2018-08-05 LAB — POCT URINALYSIS DIPSTICK
Bilirubin, UA: NEGATIVE
Blood, UA: NEGATIVE
GLUCOSE UA: NEGATIVE
Ketones, UA: NEGATIVE
Leukocytes, UA: NEGATIVE
Nitrite, UA: NEGATIVE
Protein, UA: NEGATIVE
SPEC GRAV UA: 1.01 (ref 1.010–1.025)
Urobilinogen, UA: NEGATIVE E.U./dL — AB
pH, UA: 7 (ref 5.0–8.0)

## 2018-08-05 MED ORDER — BUSPIRONE HCL 5 MG PO TABS: 5.0000 mg | ORAL_TABLET | Freq: Three times a day (TID) | ORAL | 1 refills | Status: DC

## 2018-08-05 MED ORDER — OLANZAPINE 5 MG PO TABS: 5.0000 mg | ORAL_TABLET | Freq: Every day | ORAL | 1 refills | Status: DC

## 2018-08-05 NOTE — Progress Notes (Signed)
History was provided by the patient.  Carolyn Smith is a 22 y.o. female who is here for anorexia, med check.  [name], FNP   HPI:  Pt reports that she is now taking effexor 37.5 mg because it took her a few days to pick it up. Anxiety still 5-6/10. Pacing at home a lot, doing more walking than she needs to be. This is not entirely new but seems to have worsened over the past few weeks/month and she is able to recognize that the behavior is not normal. Her therapist has been out of town for the past few weeks. She paces around about 90-120 minutes after work doing things that she doesn't necessarily need to be. Has a hard time redirecting herself.   She got engaged and is getting married at the end of September. She would like to have gotten herself to a healthier place by then.   She is agreeable to medication changes today.   No LMP recorded.  Review of Systems  Constitutional: Negative for malaise/fatigue.  Eyes: Negative for double vision.  Respiratory: Negative for shortness of breath.   Cardiovascular: Negative for chest pain and palpitations.  Gastrointestinal: Negative for abdominal pain, constipation, diarrhea, nausea and vomiting.  Genitourinary: Negative for dysuria.  Musculoskeletal: Negative for joint pain and myalgias.  Skin: Negative for rash.  Neurological: Negative for dizziness and headaches.  Endo/Heme/Allergies: Does not bruise/bleed easily.  Psychiatric/Behavioral: Negative for depression. The patient is nervous/anxious.     Patient Active Problem List   Diagnosis Date Noted  . Low bone density for age 67/01/2018  . Other osteoporosis without current pathological fracture 04/22/2018  . Moderate protein-calorie malnutrition (HCC) 03/16/2018  . Underweight 01/20/2018  . Bradycardia 01/20/2018  . Secondary amenorrhea 01/20/2018  . Migraine with aura 01/20/2018  . Adjustment disorder with anxiety 09/28/2014  . Eating disorder 03/08/2014    Current  Outpatient Medications on File Prior to Visit  Medication Sig Dispense Refill  . Multiple Vitamin (MULTIVITAMIN) tablet Take 1 tablet by mouth daily.    Marland Kitchen OLANZapine (ZYPREXA) 5 MG tablet Take 1 tablet (5 mg total) by mouth at bedtime. 30 tablet 1  . venlafaxine XR (EFFEXOR XR) 37.5 MG 24 hr capsule Take 1 capsule (37.5 mg total) by mouth daily for 7 days, THEN 2 capsules (75 mg total) daily for 23 days. 53 capsule 0  . escitalopram (LEXAPRO) 10 MG tablet Take 1.5 tablets (15 mg total) by mouth daily. 45 tablet 2  . sulfamethoxazole-trimethoprim (BACTRIM,SEPTRA) 400-80 MG tablet Take 1 tablet by mouth 2 (two) times daily.     No current facility-administered medications on file prior to visit.     No Known Allergies   Physical Exam:    Vitals:   08/05/18 1402  BP: 103/67  Pulse: 67  Weight: 105 lb 12.8 oz (48 kg)  Height: 5\' 7"  (1.702 m)    Growth percentile SmartLinks can only be used for patients less than 88 years old.  Physical Exam  Constitutional: She appears well-developed. No distress.  Very thin with prominent ribs  HENT:  Mouth/Throat: Oropharynx is clear and moist.  Neck: No thyromegaly present.  Cardiovascular: Normal rate and regular rhythm.  No murmur heard. Pulmonary/Chest: Breath sounds normal.  Abdominal: Soft. She exhibits no mass. There is no tenderness. There is no guarding.  Musculoskeletal: She exhibits no edema.  Lymphadenopathy:    She has no cervical adenopathy.  Neurological: She is alert.  Skin: Skin is warm. Capillary refill  takes 2 to 3 seconds. No rash noted.  Psychiatric: Her mood appears anxious.  Nursing note and vitals reviewed.   Assessment/Plan: 1. Moderate protein-calorie malnutrition (HCC) Has lost some significant weight today in the past few weeks likely secondary to her increased movement. I updated her dietitian on this. Dietitian reports that restored weight for her is about 125 lbs.   2. Secondary amenorrhea Will continue  to monitor.   3. Adjustment disorder with anxiety Increase effexor up to 75 mg daily as we discussed. Add buspar to further target anxious/OCD sx.  - busPIRone (BUSPAR) 5 MG tablet; Take 1 tablet (5 mg total) by mouth 3 (three) times daily.  Dispense: 90 tablet; Refill: 1  4. Anorexia nervosa Increase zyprexa to 7.5 mg daily as she is a fast metabolizer according to her genesight. We agreed that we will have a serious check in point in January, March and May/June in regards to her progress toward her goal of being healthier by her wedding. If she has not made substantial process by March, we will need to move to [facility] PHP or IOP. I think she would be very well served by virtual IOP now but she is still fairly resistant.  - OLANZapine (ZYPREXA) 5 MG tablet; Take 1 tablet (5 mg total) by mouth at bedtime.  Dispense: 45 tablet; Refill: 1  5. Screening for genitourinary condition WNL.  - POCT Urinalysis Dipstick

## 2018-08-05 NOTE — Patient Instructions (Addendum)
Go ahead and increase to 75 mg of effexor (2 capsules)  Start buspar 5 mg daily  Increase zyprexa to 7.5 mg every night   Let us know if concerns   Let's have concrete check-in points in January, March and June to assess progress. If no significant progress by March, let's consider PHP or IOP so we can help meet goals by your wedding!   Buspirone tablets What is this medicine? BUSPIRONE (byoo SPYE rone) is used to treat anxiety disorders. This medicine may be used for other purposes; ask your health care provider or pharmacist if you have questions. COMMON BRAND NAME(S): BuSpar What should I tell my health care provider before I take this medicine? They need to know if you have any of these conditions: -kidney or liver disease -an unusual or allergic reaction to buspirone, other medicines, foods, dyes, or preservatives -pregnant or trying to get pregnant -breast-feeding How should I use this medicine? Take this medicine by mouth with a glass of water. Follow the directions on the prescription label. You may take this medicine with or without food. To ensure that this medicine always works the same way for you, you should take it either always with or always without food. Take your doses at regular intervals. Do not take your medicine more often than directed. Do not stop taking except on the advice of your doctor or health care professional. Talk to your pediatrician regarding the use of this medicine in children. Special care may be needed. Overdosage: If you think you have taken too much of this medicine contact a poison control center or emergency room at once. NOTE: This medicine is only for you. Do not share this medicine with others. What if I miss a dose? If you miss a dose, take it as soon as you can. If it is almost time for your next dose, take only that dose. Do not take double or extra doses. What may interact with this medicine? Do not take this medicine with any of the  following medications: -linezolid -MAOIs like Carbex, Eldepryl, Marplan, Nardil, and Parnate -methylene blue -procarbazine This medicine may also interact with the following medications: -diazepam -digoxin -diltiazem -erythromycin -grapefruit juice -haloperidol -medicines for mental depression or mood problems -medicines for seizures like carbamazepine, phenobarbital and phenytoin -nefazodone -other medications for anxiety -rifampin -ritonavir -some antifungal medicines like itraconazole, ketoconazole, and voriconazole -verapamil -warfarin This list may not describe all possible interactions. Give your health care provider a list of all the medicines, herbs, non-prescription drugs, or dietary supplements you use. Also tell them if you smoke, drink alcohol, or use illegal drugs. Some items may interact with your medicine. What should I watch for while using this medicine? Visit your doctor or health care professional for regular checks on your progress. It may take 1 to 2 weeks before your anxiety gets better. You may get drowsy or dizzy. Do not drive, use machinery, or do anything that needs mental alertness until you know how this drug affects you. Do not stand or sit up quickly, especially if you are an older patient. This reduces the risk of dizzy or fainting spells. Alcohol can make you more drowsy and dizzy. Avoid alcoholic drinks. What side effects may I notice from receiving this medicine? Side effects that you should report to your doctor or health care professional as soon as possible: -blurred vision or other vision changes -chest pain -confusion -difficulty breathing -feelings of hostility or anger -muscle aches and pains -numbness or tingling  in hands or feet -ringing in the ears -skin rash and itching -vomiting -weakness Side effects that usually do not require medical attention (report to your doctor or health care professional if they continue or are  bothersome): -disturbed dreams, nightmares -headache -nausea -restlessness or nervousness -sore throat and nasal congestion -stomach upset This list may not describe all possible side effects. Call your doctor for medical advice about side effects. You may report side effects to FDA at 1-800-FDA-1088. Where should I keep my medicine? Keep out of the reach of children. Store at room temperature below 30 degrees C (86 degrees F). Protect from light. Keep container tightly closed. Throw away any unused medicine after the expiration date. NOTE: This sheet is a summary. It may not cover all possible information. If you have questions about this medicine, talk to your doctor, pharmacist, or health care provider.  2018 Elsevier/Gold Standard (2010-04-26 18:06:11)

## 2018-08-13 DIAGNOSIS — F5000 Anorexia nervosa, unspecified: Secondary | ICD-10-CM | POA: Diagnosis not present

## 2018-08-19 ENCOUNTER — Other Ambulatory Visit: Payer: Self-pay

## 2018-08-19 ENCOUNTER — Ambulatory Visit (INDEPENDENT_AMBULATORY_CARE_PROVIDER_SITE_OTHER): Payer: BLUE CROSS/BLUE SHIELD

## 2018-08-19 VITALS — BP 112/76 | HR 66 | Ht 67.0 in | Wt 104.4 lb

## 2018-08-19 DIAGNOSIS — Z1389 Encounter for screening for other disorder: Secondary | ICD-10-CM

## 2018-08-19 LAB — POCT URINALYSIS DIPSTICK
BILIRUBIN UA: NEGATIVE
GLUCOSE UA: NEGATIVE
KETONES UA: NEGATIVE
Leukocytes, UA: NEGATIVE
Nitrite, UA: NEGATIVE
Protein, UA: NEGATIVE
RBC UA: NEGATIVE
Urobilinogen, UA: NEGATIVE E.U./dL — AB
pH, UA: 7 (ref 5.0–8.0)

## 2018-08-20 DIAGNOSIS — F4325 Adjustment disorder with mixed disturbance of emotions and conduct: Secondary | ICD-10-CM | POA: Diagnosis not present

## 2018-08-21 ENCOUNTER — Other Ambulatory Visit: Payer: Self-pay

## 2018-08-24 ENCOUNTER — Other Ambulatory Visit: Payer: Self-pay | Admitting: Pediatrics

## 2018-08-24 DIAGNOSIS — F5000 Anorexia nervosa, unspecified: Secondary | ICD-10-CM

## 2018-08-24 DIAGNOSIS — Z713 Dietary counseling and surveillance: Secondary | ICD-10-CM | POA: Diagnosis not present

## 2018-08-24 MED ORDER — VENLAFAXINE HCL ER 75 MG PO CP24: 75.0000 mg | ORAL_CAPSULE | Freq: Every day | ORAL | 2 refills | Status: DC

## 2018-08-24 MED ORDER — OLANZAPINE 5 MG PO TABS: 5.0000 mg | ORAL_TABLET | Freq: Every day | ORAL | 1 refills | Status: DC

## 2018-08-31 DIAGNOSIS — F5000 Anorexia nervosa, unspecified: Secondary | ICD-10-CM | POA: Diagnosis not present

## 2018-09-01 ENCOUNTER — Ambulatory Visit (INDEPENDENT_AMBULATORY_CARE_PROVIDER_SITE_OTHER): Payer: BLUE CROSS/BLUE SHIELD | Admitting: Pediatrics

## 2018-09-01 VITALS — BP 98/66 | HR 66 | Ht 66.54 in | Wt 108.8 lb

## 2018-09-01 DIAGNOSIS — F5000 Anorexia nervosa, unspecified: Secondary | ICD-10-CM

## 2018-09-01 DIAGNOSIS — E440 Moderate protein-calorie malnutrition: Secondary | ICD-10-CM | POA: Diagnosis not present

## 2018-09-01 DIAGNOSIS — R001 Bradycardia, unspecified: Secondary | ICD-10-CM

## 2018-09-01 DIAGNOSIS — N911 Secondary amenorrhea: Secondary | ICD-10-CM

## 2018-09-01 DIAGNOSIS — F4322 Adjustment disorder with anxiety: Secondary | ICD-10-CM

## 2018-09-01 DIAGNOSIS — Z1389 Encounter for screening for other disorder: Secondary | ICD-10-CM | POA: Diagnosis not present

## 2018-09-01 LAB — POCT URINALYSIS DIPSTICK
Bilirubin, UA: NEGATIVE
Glucose, UA: NEGATIVE
KETONES UA: NEGATIVE
Leukocytes, UA: NEGATIVE
NITRITE UA: NEGATIVE
PROTEIN UA: NEGATIVE
RBC UA: NEGATIVE
SPEC GRAV UA: 1.01 (ref 1.010–1.025)
Urobilinogen, UA: NEGATIVE E.U./dL — AB
pH, UA: 7 (ref 5.0–8.0)

## 2018-09-01 MED ORDER — VENLAFAXINE HCL ER 150 MG PO CP24: 150.0000 mg | ORAL_CAPSULE | Freq: Every day | ORAL | 1 refills | Status: DC

## 2018-09-01 NOTE — Patient Instructions (Signed)
Increase Effexor to 150 mg daily. Please make sure you are currently only taking 75 mg daily

## 2018-09-01 NOTE — Progress Notes (Signed)
History was provided by the patient.  Carolyn Smith is a 22 y.o. female who is here for anorexia, anxiety.  Verneda SkillHacker, Caroline T, FNP   HPI:  Pt reports that she has been working really hard to decrease her pacing behavior after she heard that her weight had continued to decline after the last RN visit. She has been filling her time with some journaling, watching TV and having her fiancee at the house more. Most of her identity outside her job has been related to dancing as a teen and dog walking/yoga as an adult. She has not worked to find any groups or other things outside work that are fulfilling to this point.   She continues to see dietitian weekly and therapist every other week.   Her anxiety continues to be fairly significant and she doesn't think that the increase in the zyprexa has helped much yet. She is amenable to increasing effexor today.   No LMP recorded.  Review of Systems  Constitutional: Negative for malaise/fatigue.  Eyes: Negative for double vision.  Respiratory: Negative for shortness of breath.   Cardiovascular: Negative for chest pain and palpitations.  Gastrointestinal: Negative for abdominal pain, constipation, diarrhea, nausea and vomiting.  Genitourinary: Negative for dysuria.  Musculoskeletal: Negative for joint pain and myalgias.  Skin: Negative for rash.  Neurological: Negative for dizziness and headaches.  Endo/Heme/Allergies: Does not bruise/bleed easily.  Psychiatric/Behavioral: Negative for depression. The patient is nervous/anxious. The patient does not have insomnia.     Patient Active Problem List   Diagnosis Date Noted  . Low bone density for age 40/01/2018  . Other osteoporosis without current pathological fracture 04/22/2018  . Moderate protein-calorie malnutrition (HCC) 03/16/2018  . Underweight 01/20/2018  . Bradycardia 01/20/2018  . Secondary amenorrhea 01/20/2018  . Migraine with aura 01/20/2018  . Adjustment disorder with anxiety  09/28/2014  . Eating disorder 03/08/2014    Current Outpatient Medications on File Prior to Visit  Medication Sig Dispense Refill  . busPIRone (BUSPAR) 5 MG tablet Take 1 tablet (5 mg total) by mouth 3 (three) times daily. 90 tablet 1  . Multiple Vitamin (MULTIVITAMIN) tablet Take 1 tablet by mouth daily.    Marland Kitchen. OLANZapine (ZYPREXA) 5 MG tablet Take 1 tablet (5 mg total) by mouth at bedtime. 30 tablet 1  . venlafaxine XR (EFFEXOR-XR) 75 MG 24 hr capsule Take 1 capsule (75 mg total) by mouth daily. 30 capsule 2  . sulfamethoxazole-trimethoprim (BACTRIM,SEPTRA) 400-80 MG tablet Take 1 tablet by mouth 2 (two) times daily.     No current facility-administered medications on file prior to visit.     No Known Allergies   Physical Exam:    Vitals:   09/01/18 1342  BP: 98/66  Pulse: 66  Weight: 108 lb 12.8 oz (49.4 kg)  Height: 5' 6.54" (1.69 m)    Growth percentile SmartLinks can only be used for patients less than 24 years old.  Physical Exam  Constitutional: She appears well-developed. No distress.  HENT:  Mouth/Throat: Oropharynx is clear and moist.  Neck: No thyromegaly present.  Cardiovascular: Normal rate and regular rhythm.  No murmur heard. Pulmonary/Chest: Breath sounds normal.  Abdominal: Soft. She exhibits no mass. There is no tenderness. There is no guarding.  Musculoskeletal: She exhibits no edema.  Lymphadenopathy:    She has no cervical adenopathy.  Neurological: She is alert.  Skin: Skin is warm. No rash noted.  Psychiatric: She has a normal mood and affect.  Nursing note and vitals reviewed.  Assessment/Plan: 1. Moderate protein-calorie malnutrition (HCC) Has gained 4 pounds in 2 weeks which is a significant improvement in the downward trend she had been on previously.   2. Secondary amenorrhea Associated with being underweight.   3. Bradycardia Stable.   4. Anorexia nervosa Has a team together- still fairly resistant to higher level of care but low  threshold for team to push harder for this if any further weight loss.   5. Adjustment disorder with anxiety Will increase effexor to 150 mg today- discussed that she may need increase in zyprexa soon as she is a rapid metabolizer.  - venlafaxine XR (EFFEXOR XR) 150 MG 24 hr capsule; Take 1 capsule (150 mg total) by mouth daily with breakfast.  Dispense: 30 capsule; Refill: 1  6. Screening for genitourinary condition WNL.  - POCT urinalysis dipstick

## 2018-09-07 DIAGNOSIS — F5000 Anorexia nervosa, unspecified: Secondary | ICD-10-CM | POA: Diagnosis not present

## 2018-09-16 ENCOUNTER — Ambulatory Visit (INDEPENDENT_AMBULATORY_CARE_PROVIDER_SITE_OTHER): Payer: BLUE CROSS/BLUE SHIELD

## 2018-09-16 VITALS — BP 115/76 | HR 84 | Ht 67.0 in | Wt 112.6 lb

## 2018-09-16 DIAGNOSIS — F5000 Anorexia nervosa, unspecified: Secondary | ICD-10-CM

## 2018-09-16 DIAGNOSIS — Z1389 Encounter for screening for other disorder: Secondary | ICD-10-CM | POA: Diagnosis not present

## 2018-09-16 LAB — POCT URINALYSIS DIPSTICK
Bilirubin, UA: NEGATIVE
Blood, UA: NEGATIVE
Glucose, UA: NEGATIVE
Ketones, UA: NEGATIVE
Leukocytes, UA: NEGATIVE
NITRITE UA: NEGATIVE
PH UA: 7 (ref 5.0–8.0)
PROTEIN UA: NEGATIVE
Spec Grav, UA: 1.015 (ref 1.010–1.025)
UROBILINOGEN UA: NEGATIVE U/dL — AB

## 2018-09-16 NOTE — Progress Notes (Signed)
Pt here today for vitals check. Collaborated with NP- plan of care made. Vitals stable today. Follow up scheduled for 1/2.

## 2018-09-17 DIAGNOSIS — F4325 Adjustment disorder with mixed disturbance of emotions and conduct: Secondary | ICD-10-CM | POA: Diagnosis not present

## 2018-10-01 ENCOUNTER — Ambulatory Visit (INDEPENDENT_AMBULATORY_CARE_PROVIDER_SITE_OTHER): Payer: BLUE CROSS/BLUE SHIELD | Admitting: Pediatrics

## 2018-10-01 ENCOUNTER — Other Ambulatory Visit: Payer: Self-pay

## 2018-10-01 ENCOUNTER — Encounter: Payer: Self-pay | Admitting: Pediatrics

## 2018-10-01 VITALS — BP 113/79 | HR 67 | Ht 66.93 in | Wt 107.6 lb

## 2018-10-01 DIAGNOSIS — F5000 Anorexia nervosa, unspecified: Secondary | ICD-10-CM

## 2018-10-01 DIAGNOSIS — E440 Moderate protein-calorie malnutrition: Secondary | ICD-10-CM

## 2018-10-01 DIAGNOSIS — F4322 Adjustment disorder with anxiety: Secondary | ICD-10-CM

## 2018-10-01 DIAGNOSIS — Z1389 Encounter for screening for other disorder: Secondary | ICD-10-CM | POA: Diagnosis not present

## 2018-10-01 DIAGNOSIS — N911 Secondary amenorrhea: Secondary | ICD-10-CM | POA: Diagnosis not present

## 2018-10-01 LAB — POCT URINALYSIS DIPSTICK
Bilirubin, UA: NEGATIVE
Blood, UA: NEGATIVE
GLUCOSE UA: NEGATIVE
Ketones, UA: NEGATIVE
Nitrite, UA: NEGATIVE
Protein, UA: NEGATIVE
Spec Grav, UA: <=1.005 — AB (ref 1.010–1.025)
Urobilinogen, UA: NEGATIVE E.U./dL — AB
pH, UA: 7 (ref 5.0–8.0)

## 2018-10-01 MED ORDER — OLANZAPINE 10 MG PO TABS: 10.0000 mg | ORAL_TABLET | Freq: Every day | ORAL | 2 refills | Status: DC

## 2018-10-01 NOTE — Progress Notes (Signed)
History was provided by the patient.  Carolyn Smith is a 23 y.o. female who is here for anorexia, secondary amenorrhea, moderate malnutrition.  [name], FNP   HPI:  Pt reports that he holidays have been somewhat stressful. She had some time off work but large family meals were challenging and she has been pacing and moving more at home with more free time. She has not looked into anything to do outside of work yet.   24 hour recall:  L: [place] sandwich, carrots, pretzels B: yogurt covered raisins, string cheese B: oatmeal with banana, english muffin S: yogurt and granola D: wrap from chick fila and fruit cup S: [place] granola bar  Sees dietitian on Monday. Still seeing therapist.   Denies symptoms of dizziness, headaches.   Anxiety is still pretty high.   No LMP recorded.  Review of Systems  Constitutional: Negative for malaise/fatigue.  Eyes: Negative for double vision.  Respiratory: Negative for shortness of breath.   Cardiovascular: Negative for chest pain and palpitations.  Gastrointestinal: Negative for abdominal pain, constipation, diarrhea, nausea and vomiting.  Genitourinary: Negative for dysuria.  Musculoskeletal: Negative for joint pain and myalgias.  Skin: Negative for rash.  Neurological: Negative for dizziness and headaches.  Endo/Heme/Allergies: Does not bruise/bleed easily.  Psychiatric/Behavioral: Negative for depression. The patient is nervous/anxious. The patient does not have insomnia.     Patient Active Problem List   Diagnosis Date Noted  . Low bone density for age 55/01/2018  . Other osteoporosis without current pathological fracture 04/22/2018  . Moderate protein-calorie malnutrition (HCC) 03/16/2018  . Underweight 01/20/2018  . Bradycardia 01/20/2018  . Secondary amenorrhea 01/20/2018  . Migraine with aura 01/20/2018  . Adjustment disorder with anxiety 09/28/2014  . Eating disorder 03/08/2014    Current Outpatient  Medications on File Prior to Visit  Medication Sig Dispense Refill  . busPIRone (BUSPAR) 5 MG tablet Take 1 tablet (5 mg total) by mouth 3 (three) times daily. 90 tablet 1  . Multiple Vitamin (MULTIVITAMIN) tablet Take 1 tablet by mouth daily.    Marland Kitchen OLANZapine (ZYPREXA) 5 MG tablet Take 1 tablet (5 mg total) by mouth at bedtime. 30 tablet 1  . venlafaxine XR (EFFEXOR XR) 150 MG 24 hr capsule Take 1 capsule (150 mg total) by mouth daily with breakfast. 30 capsule 1  . sulfamethoxazole-trimethoprim (BACTRIM,SEPTRA) 400-80 MG tablet Take 1 tablet by mouth 2 (two) times daily.     No current facility-administered medications on file prior to visit.     No Known Allergies   Physical Exam:    Vitals:   10/01/18 1343  BP: 113/79  Pulse: 67  Weight: 107 lb 9.6 oz (48.8 kg)  Height: 5' 6.93" (1.7 m)    Growth percentile SmartLinks can only be used for patients less than 73 years old.  Physical Exam Vitals signs and nursing note reviewed.  Constitutional:      General: She is not in acute distress.    Appearance: She is well-developed.     Comments: Very thin  Neck:     Thyroid: No thyromegaly.  Cardiovascular:     Rate and Rhythm: Normal rate and regular rhythm.     Heart sounds: No murmur.  Pulmonary:     Breath sounds: Normal breath sounds.  Abdominal:     Palpations: Abdomen is soft. There is no mass.     Tenderness: There is no abdominal tenderness. There is no guarding.  Lymphadenopathy:     Cervical:  No cervical adenopathy.  Skin:    General: Skin is warm.     Findings: No rash.  Neurological:     Mental Status: She is alert.     Assessment/Plan: 1. Moderate protein-calorie malnutrition (HCC) Has lost weight secondary to holidays, stress, and dietitian being out of town for 3 weeks. We discussed ways for her to boost density over the weekend and plan more carefully with dietitian on Monday.   2. Secondary amenorrhea Persistent related to malnutrition.   3.  Anorexia nervosa Increased pacing and anxiety- will increase zyprexa today.   4. Adjustment disorder with anxiety May need to increase buspar in the future- discussed that if her brain isn't getting enough, it makes it difficult for meds to work too.   5. Screening for genitourinary condition WNL.  - POCT Urinalysis Dipstick

## 2018-10-01 NOTE — Patient Instructions (Addendum)
Focus on density in what you are eating like adding hummus to your lunch, peanut butter at breakfast and dressing on things like your wrap   Increase zyprexa to 10 mg daily   [employer] [facility]- January 15th at 5:30 pm at [employer]!

## 2018-10-05 DIAGNOSIS — Z713 Dietary counseling and surveillance: Secondary | ICD-10-CM | POA: Diagnosis not present

## 2018-10-08 DIAGNOSIS — F4325 Adjustment disorder with mixed disturbance of emotions and conduct: Secondary | ICD-10-CM | POA: Diagnosis not present

## 2018-10-12 DIAGNOSIS — Z713 Dietary counseling and surveillance: Secondary | ICD-10-CM | POA: Diagnosis not present

## 2018-10-15 ENCOUNTER — Ambulatory Visit (INDEPENDENT_AMBULATORY_CARE_PROVIDER_SITE_OTHER): Payer: BLUE CROSS/BLUE SHIELD

## 2018-10-15 VITALS — BP 113/75 | HR 74 | Ht 67.0 in | Wt 111.2 lb

## 2018-10-15 DIAGNOSIS — F4325 Adjustment disorder with mixed disturbance of emotions and conduct: Secondary | ICD-10-CM | POA: Diagnosis not present

## 2018-10-15 DIAGNOSIS — Z1389 Encounter for screening for other disorder: Secondary | ICD-10-CM | POA: Diagnosis not present

## 2018-10-15 LAB — POCT URINALYSIS DIPSTICK
Bilirubin, UA: NEGATIVE
Glucose, UA: NEGATIVE
Ketones, UA: NEGATIVE
LEUKOCYTES UA: NEGATIVE
NITRITE UA: NEGATIVE
PH UA: 7 (ref 5.0–8.0)
PROTEIN UA: POSITIVE — AB
RBC UA: NEGATIVE
SPEC GRAV UA: 1.015 (ref 1.010–1.025)
Urobilinogen, UA: NEGATIVE E.U./dL — AB

## 2018-10-15 NOTE — Progress Notes (Signed)
Pt here today for vitals check. Collaborated with NP- plan of care made. Follow up scheduled for 1/29. Vitals stable.

## 2018-10-21 DIAGNOSIS — F5000 Anorexia nervosa, unspecified: Secondary | ICD-10-CM | POA: Diagnosis not present

## 2018-10-22 DIAGNOSIS — F4325 Adjustment disorder with mixed disturbance of emotions and conduct: Secondary | ICD-10-CM | POA: Diagnosis not present

## 2018-10-26 DIAGNOSIS — F5000 Anorexia nervosa, unspecified: Secondary | ICD-10-CM | POA: Diagnosis not present

## 2018-10-28 ENCOUNTER — Encounter: Payer: Self-pay | Admitting: Pediatrics

## 2018-10-28 ENCOUNTER — Ambulatory Visit (INDEPENDENT_AMBULATORY_CARE_PROVIDER_SITE_OTHER): Payer: BLUE CROSS/BLUE SHIELD | Admitting: Pediatrics

## 2018-10-28 VITALS — BP 111/76 | HR 73 | Ht 66.63 in | Wt 107.8 lb

## 2018-10-28 DIAGNOSIS — N911 Secondary amenorrhea: Secondary | ICD-10-CM

## 2018-10-28 DIAGNOSIS — E440 Moderate protein-calorie malnutrition: Secondary | ICD-10-CM | POA: Diagnosis not present

## 2018-10-28 DIAGNOSIS — Z1389 Encounter for screening for other disorder: Secondary | ICD-10-CM | POA: Diagnosis not present

## 2018-10-28 DIAGNOSIS — F4322 Adjustment disorder with anxiety: Secondary | ICD-10-CM

## 2018-10-28 DIAGNOSIS — F5000 Anorexia nervosa, unspecified: Secondary | ICD-10-CM | POA: Diagnosis not present

## 2018-10-28 DIAGNOSIS — M818 Other osteoporosis without current pathological fracture: Secondary | ICD-10-CM

## 2018-10-28 LAB — POCT URINALYSIS DIPSTICK
Bilirubin, UA: NEGATIVE
Glucose, UA: NEGATIVE
Ketones, UA: NEGATIVE
LEUKOCYTES UA: NEGATIVE
NITRITE UA: NEGATIVE
PROTEIN UA: NEGATIVE
RBC UA: NEGATIVE
Urobilinogen, UA: 1 E.U./dL
pH, UA: 7 (ref 5.0–8.0)

## 2018-10-28 MED ORDER — VENLAFAXINE HCL ER 150 MG PO CP24: 150.0000 mg | ORAL_CAPSULE | Freq: Every day | ORAL | 1 refills | Status: DC

## 2018-10-28 MED ORDER — BUSPIRONE HCL 7.5 MG PO TABS: 7.5000 mg | ORAL_TABLET | Freq: Three times a day (TID) | ORAL | 2 refills | Status: DC

## 2018-10-28 MED ORDER — OLANZAPINE 5 MG PO TABS: 5.0000 mg | ORAL_TABLET | Freq: Every day | ORAL | 2 refills | Status: DC

## 2018-10-28 NOTE — Progress Notes (Signed)
History was provided by the patient.  Carolyn Smith is a 23 y.o. female who is here for follow up of anorexia, secondary amenorrhea, and moderate malnutrition. Had lost weight at her last visit in the setting of holidays, stress, and dietitian being out of town for 3 weeks. Zyprexa was increased at last visit due to increased pacing and anxiety; no changes to her buspar 5mg  daily or effexor 150mg  daily at that time. She had a weight check on 1/16 and had gained   [name], FNP   HPI:   Chief Complaint  Patient presents with  . Follow-up  . Eating Disorder   Pt reports that going up on the Zyprexa is making her sleepier in the mornings when she wakes up (used to just have mild sleepiness in the mornings). No improvements in anxious feelings or pacing -- she is still pacing more often than not. Effexor increase last month was not really helpful in terms of improving her anxiety. Therapy once a week is helpful, could go to twice a week--she thinks this may best be able to help control her anxiety. Continues to see a dietitian once a week. Reports compliance with meals and snacks, every day. Reports decreased stress levels since the holidays, though still has some rough days in terms of her emotions.   No headaches, dizziness, chest pain, palpitations, abdominal pain, nausea, vomiting, diarrhea, light-headedness   Hobbies outside of work include: still looking for things to do outside of work Activity: walks dog daily, but no other activity  24 hour recall:  B: oatmeal with a banana and English muffin Snack: Almonds and yogurt covered raisins L: [place] sandwich, carrots, pretzels Snack: granola bar Dinner: rice, black beans, and grilled chicken Snack: yogut, granola, berries Drinks:  No soda, juice, milk; drinks 100oz water daily at least Vitamins/supplements: MVI daily   No LMP recorded. Still without periods  ROS Negative except where noted above  Patient Active Problem List    Diagnosis Date Noted  . Low bone density for age 60/01/2018  . Other osteoporosis without current pathological fracture 04/22/2018  . Moderate protein-calorie malnutrition (HCC) 03/16/2018  . Underweight 01/20/2018  . Bradycardia 01/20/2018  . Secondary amenorrhea 01/20/2018  . Migraine with aura 01/20/2018  . Adjustment disorder with anxiety 09/28/2014  . Eating disorder 03/08/2014    Current Outpatient Medications on File Prior to Visit  Medication Sig Dispense Refill  . busPIRone (BUSPAR) 5 MG tablet Take 1 tablet (5 mg total) by mouth 3 (three) times daily. 90 tablet 1  . Multiple Vitamin (MULTIVITAMIN) tablet Take 1 tablet by mouth daily.    Marland Kitchen OLANZapine (ZYPREXA) 10 MG tablet Take 1 tablet (10 mg total) by mouth at bedtime. 30 tablet 2  . venlafaxine XR (EFFEXOR XR) 150 MG 24 hr capsule Take 1 capsule (150 mg total) by mouth daily with breakfast. 30 capsule 1  . sulfamethoxazole-trimethoprim (BACTRIM,SEPTRA) 400-80 MG tablet Take 1 tablet by mouth 2 (two) times daily.     No current facility-administered medications on file prior to visit.     No Known Allergies  Physical Exam:    Vitals:   10/28/18 1343  BP: 111/76  Pulse: 73  Weight: 107 lb 12.8 oz (48.9 kg)  Height: 5' 6.63" (1.692 m)    Growth percentile SmartLinks can only be used for patients less than 87 years old.  Physical Exam Vitals signs and nursing note reviewed.  Constitutional:      Comments: Thin appearing  HENT:     Head: Normocephalic and atraumatic.     Nose: No congestion.     Mouth/Throat:     Mouth: Mucous membranes are moist.     Pharynx: No oropharyngeal exudate or posterior oropharyngeal erythema.     Comments: No abnormal teeth staining Eyes:     Extraocular Movements: Extraocular movements intact.     Pupils: Pupils are equal, round, and reactive to light.  Neck:     Musculoskeletal: Normal range of motion and neck supple.     Comments: Parotids are neither enlarged nor tender   Cardiovascular:     Rate and Rhythm: Normal rate.     Pulses: Normal pulses.     Heart sounds: No murmur.  Pulmonary:     Effort: Pulmonary effort is normal.     Breath sounds: Normal breath sounds. No wheezing, rhonchi or rales.  Abdominal:     General: Abdomen is flat. There is no distension.     Palpations: There is no mass.     Tenderness: There is no abdominal tenderness.  Lymphadenopathy:     Cervical: No cervical adenopathy.  Skin:    General: Skin is warm.     Capillary Refill: Capillary refill takes less than 2 seconds.     Comments: No visible facial hair  Neurological:     Mental Status: She is alert.  Psychiatric:        Thought Content: Thought content normal.        Judgment: Judgment normal.     Comments: Became tearful when talking about the potential to be admitted to a residential facility     Assessment/Plan: [name] is a 23 y.o. female with a history of anorexia nevosa, moderate malnutrition, secondary amenorrhea, osteoporosis, and adjustment disorder with anxiety. Unfortunately, has lost weight again and continues to progress slightly, then regress. Fortunately without bradycardia, orthostatic symptoms, chest pain or palpitations; still with secondary amenorrhea. Spent a while today talking about how her current outpatient therapy plan is not achieving our mutual goals to improve her health. Much time was spent talking about how escalation of care, from our medical perspective, is warranted. Advised the patient to talk with her therapist and dietitian about this plan. Also encouraged her to reach out to [facility] or other local eating disorder centers to call and inquire about services vs go for a second opinion and hear about different options including intensive outpatient therapy vs residential center stay ve inpatient eating disorder unit admission. Patient expressed understanding and would like to have some time to talk with therapist/dietitian/fiance/family  prior to making decisions on next steps(we will email her treatment team independently). We have scheduled an RN visit in two weeks and another assessment with the Adolescent team in 4 weeks. If continued issues with diet recovery, may need to get an echo in the coming months (has none at baseline). Last DEXA was July 2019.  In terms of her anxiety -- no improvement with the increase in zyprexa. Given increased sleepiness that is interfering with work, will decrease back down to 5mg  daily. Will try increasing buspar to 7.5mg  TID and see how this works for her. No changes to Effexor. Advised patient to increase outpatient counseling frequency to twice weekly.   25 minutes was spent face-to-face with patient in both evaluation and discussing treatment plans.  1. Moderate protein-calorie malnutrition (HCC) 2. Anorexia nervosa 3. Secondary amenorrhea - as above  4. Adjustment disorder with anxiety - refill meds today; dose  adjustments as above - venlafaxine XR (EFFEXOR XR) 150 MG 24 hr capsule; Take 1 capsule (150 mg total) by mouth daily with breakfast.  Dispense: 30 capsule; Refill: 1 - OLANZapine (ZYPREXA) 5 MG tablet; Take 1 tablet (5 mg total) by mouth at bedtime.  Dispense: 30 tablet; Refill: 2 - busPIRone (BUSPAR) 7.5 MG tablet; Take 1 tablet (7.5 mg total) by mouth 3 (three) times daily.  Dispense: 90 tablet; Refill: 2  5. Screening for genitourinary condition - npo proteinuria or ketonuria; spec grav shows good hydration - POCT urinalysis dipstick   Follow up in 2 weeks with RN weight check, 4 weeks with visit if she elects to continue outpatient.  Irene ShipperZachary Lillianna Sabel, MD 10/28/18

## 2018-10-28 NOTE — Patient Instructions (Addendum)
It was great seeing you today! We are happy that you are working very hard. Today, we discussed:  - decreasing your Zyprexa back down to 5mg  every night - increasing your Buspar dose -- 7.5mg  three times a day. I have sent this to the pharmacy.  - No changes to your Effexor dose - I think it will be beneficial to increase your therapy sessions to two times weekly. Please ask your therapist if this is feasible.

## 2018-10-29 NOTE — Progress Notes (Signed)
I have reviewed the resident's note and plan of care and helped develop the plan as necessary.  Had an honest but supportive discussion with Carolyn Smith about next steps in her care. She ultimately has shown that despite very hard work, her recovery is not likely to progress with an outpatient level of care. We discussed getting a further assessment at [place], [facility] or other treatment facility that she wishes. She was tearful but understanding. She will talk to the other members of her team about it further. I communicated with her dietitian who agreed with our assessment and has already started making a list of questions for [name] to ask of inpatient treatment teams.

## 2018-11-02 DIAGNOSIS — F5000 Anorexia nervosa, unspecified: Secondary | ICD-10-CM | POA: Diagnosis not present

## 2018-11-03 ENCOUNTER — Telehealth: Payer: Self-pay | Admitting: Pediatrics

## 2018-11-03 DIAGNOSIS — R630 Anorexia: Secondary | ICD-10-CM

## 2018-11-03 DIAGNOSIS — E440 Moderate protein-calorie malnutrition: Secondary | ICD-10-CM

## 2018-11-03 NOTE — Telephone Encounter (Signed)
Patient requesting admission paperwork to be completed for [id number] and [place]. Needs updated labs. Will bring in this week for these. Continues to have significant pacing behavior and difficulty at home with weight restoration in relation to living alone. I suspect she would be most appropriate for residential level of care based on my assessment, however, I advised her that both places will give her a full assessment and their recommendations. Orders placed in Epic and will schedule at her convenience.

## 2018-11-04 ENCOUNTER — Ambulatory Visit: Payer: BLUE CROSS/BLUE SHIELD

## 2018-11-04 DIAGNOSIS — E440 Moderate protein-calorie malnutrition: Secondary | ICD-10-CM

## 2018-11-04 DIAGNOSIS — R630 Anorexia: Secondary | ICD-10-CM

## 2018-11-04 NOTE — Progress Notes (Unsigned)
Patient came in for labs Quantiferon, CMP, CBC with diff, Phosphorus,Amylase, Lipase, Ionized Calcium,Hepatitis Panel, Acute,UA, Drugs of Abuse, Urine Preg and Magnesium. Labs ordered by Alfonso Ramus. Successful collection.

## 2018-11-05 DIAGNOSIS — F4325 Adjustment disorder with mixed disturbance of emotions and conduct: Secondary | ICD-10-CM | POA: Diagnosis not present

## 2018-11-05 LAB — PREGNANCY, URINE: PREG TEST UR: NEGATIVE

## 2018-11-05 LAB — URINALYSIS
BILIRUBIN URINE: NEGATIVE
GLUCOSE, UA: NEGATIVE
Hgb urine dipstick: NEGATIVE
KETONES UR: NEGATIVE
Leukocytes, UA: NEGATIVE
Nitrite: NEGATIVE
PROTEIN: NEGATIVE
Specific Gravity, Urine: 1.006 (ref 1.001–1.03)
pH: >=8.5 — AB (ref 5.0–8.0)

## 2018-11-05 LAB — COMPREHENSIVE METABOLIC PANEL
AG RATIO: 2 (calc) (ref 1.0–2.5)
ALT: 33 U/L — ABNORMAL HIGH (ref 6–29)
AST: 35 U/L — AB (ref 10–30)
Albumin: 4.7 g/dL (ref 3.6–5.1)
Alkaline phosphatase (APISO): 71 U/L (ref 31–125)
BILIRUBIN TOTAL: 0.4 mg/dL (ref 0.2–1.2)
BUN: 13 mg/dL (ref 7–25)
CO2: 29 mmol/L (ref 20–32)
CREATININE: 0.78 mg/dL (ref 0.50–1.10)
Calcium: 10.1 mg/dL (ref 8.6–10.2)
Chloride: 101 mmol/L (ref 98–110)
Globulin: 2.4 g/dL (calc) (ref 1.9–3.7)
Glucose, Bld: 86 mg/dL (ref 65–99)
Potassium: 4.7 mmol/L (ref 3.5–5.3)
Sodium: 139 mmol/L (ref 135–146)
Total Protein: 7.1 g/dL (ref 6.1–8.1)

## 2018-11-05 LAB — DRUGS OF ABUSE SCREEN W/O ALC, ROUTINE URINE
AMPHETAMINES (1000 ng/mL SCRN): NEGATIVE
BARBITURATES: NEGATIVE
BENZODIAZEPINES: NEGATIVE
COCAINE METABOLITES: NEGATIVE
MARIJUANA MET (50 NG/ML SCRN): NEGATIVE
METHADONE: NEGATIVE
METHAQUALONE: NEGATIVE
OPIATES: NEGATIVE
PHENCYCLIDINE: NEGATIVE
PROPOXYPHENE: NEGATIVE

## 2018-11-06 LAB — QUANTIFERON-TB GOLD PLUS
MITOGEN-NIL: 6.03 [IU]/mL
NIL: 0.01 IU/mL
QUANTIFERON-TB GOLD PLUS: NEGATIVE
TB1-NIL: 0.01 [IU]/mL
TB2-NIL: 0 IU/mL

## 2018-11-06 LAB — CBC WITH DIFFERENTIAL/PLATELET
Absolute Monocytes: 262 cells/uL (ref 200–950)
BASOS PCT: 0.5 %
Basophils Absolute: 21 cells/uL (ref 0–200)
Eosinophils Absolute: 0 cells/uL — ABNORMAL LOW (ref 15–500)
Eosinophils Relative: 0 %
HCT: 42 % (ref 35.0–45.0)
Hemoglobin: 14.2 g/dL (ref 11.7–15.5)
Lymphs Abs: 1287 cells/uL (ref 850–3900)
MCH: 31.6 pg (ref 27.0–33.0)
MCHC: 33.8 g/dL (ref 32.0–36.0)
MCV: 93.3 fL (ref 80.0–100.0)
MPV: 11.4 fL (ref 7.5–12.5)
Monocytes Relative: 6.4 %
NEUTROS ABS: 2530 {cells}/uL (ref 1500–7800)
Neutrophils Relative %: 61.7 %
Platelets: 304 10*3/uL (ref 140–400)
RBC: 4.5 10*6/uL (ref 3.80–5.10)
RDW: 11.2 % (ref 11.0–15.0)
Total Lymphocyte: 31.4 %
WBC: 4.1 10*3/uL (ref 3.8–10.8)

## 2018-11-06 LAB — HEPATITIS PANEL, ACUTE
Hep A IgM: NONREACTIVE
Hep B C IgM: NONREACTIVE
Hepatitis B Surface Ag: NONREACTIVE
Hepatitis C Ab: NONREACTIVE
SIGNAL TO CUT-OFF: 0.02 (ref <1.00)

## 2018-11-06 LAB — CALCIUM, IONIZED: Calcium, Ion: 5.38 mg/dL (ref 4.8–5.6)

## 2018-11-06 LAB — AMYLASE: Amylase: 113 U/L — ABNORMAL HIGH (ref 21–101)

## 2018-11-06 LAB — LIPASE: Lipase: 339 U/L — ABNORMAL HIGH (ref 7–60)

## 2018-11-06 LAB — MAGNESIUM: Magnesium: 2 mg/dL (ref 1.5–2.5)

## 2018-11-06 LAB — PHOSPHORUS: Phosphorus: 3 mg/dL (ref 2.5–4.5)

## 2018-11-09 ENCOUNTER — Encounter: Payer: Self-pay | Admitting: Pediatrics

## 2018-11-09 ENCOUNTER — Ambulatory Visit (INDEPENDENT_AMBULATORY_CARE_PROVIDER_SITE_OTHER): Payer: BLUE CROSS/BLUE SHIELD | Admitting: Pediatrics

## 2018-11-09 VITALS — BP 116/76 | HR 71 | Ht 67.0 in | Wt 108.0 lb

## 2018-11-09 DIAGNOSIS — R748 Abnormal levels of other serum enzymes: Secondary | ICD-10-CM

## 2018-11-09 DIAGNOSIS — Z713 Dietary counseling and surveillance: Secondary | ICD-10-CM | POA: Diagnosis not present

## 2018-11-09 NOTE — Progress Notes (Signed)
History was provided by the patient.  Carolyn Smith is a 23 y.o. female who is here for abnormal labs .  Alfonso Ramus T, FNP   HPI:  Pt reports that she has not had any abdominal pain. She has no other symptoms. She is amenable to repeat labs today. Denies ETOH abuse.   No LMP recorded.  Review of Systems  Constitutional: Negative for malaise/fatigue.  Eyes: Negative for double vision.  Respiratory: Negative for shortness of breath.   Cardiovascular: Negative for chest pain and palpitations.  Gastrointestinal: Negative for abdominal pain, constipation, diarrhea, nausea and vomiting.  Genitourinary: Negative for dysuria.  Musculoskeletal: Negative for joint pain and myalgias.  Skin: Negative for rash.  Neurological: Negative for dizziness and headaches.  Endo/Heme/Allergies: Does not bruise/bleed easily.    Patient Active Problem List   Diagnosis Date Noted  . Low bone density for age 31/01/2018  . Other osteoporosis without current pathological fracture 04/22/2018  . Moderate protein-calorie malnutrition (HCC) 03/16/2018  . Underweight 01/20/2018  . Bradycardia 01/20/2018  . Secondary amenorrhea 01/20/2018  . Migraine with aura 01/20/2018  . Adjustment disorder with anxiety 09/28/2014  . Eating disorder 03/08/2014    Current Outpatient Medications on File Prior to Visit  Medication Sig Dispense Refill  . busPIRone (BUSPAR) 7.5 MG tablet Take 1 tablet (7.5 mg total) by mouth 3 (three) times daily. 90 tablet 2  . Multiple Vitamin (MULTIVITAMIN) tablet Take 1 tablet by mouth daily.    Marland Kitchen OLANZapine (ZYPREXA) 5 MG tablet Take 1 tablet (5 mg total) by mouth at bedtime. 30 tablet 2  . sulfamethoxazole-trimethoprim (BACTRIM,SEPTRA) 400-80 MG tablet Take 1 tablet by mouth 2 (two) times daily.    Marland Kitchen venlafaxine XR (EFFEXOR XR) 150 MG 24 hr capsule Take 1 capsule (150 mg total) by mouth daily with breakfast. 30 capsule 1   No current facility-administered medications on file prior  to visit.     No Known Allergies   Physical Exam:    Vitals:   11/09/18 1139  BP: 116/76  Pulse: 71  Weight: 108 lb (49 kg)  Height: 5\' 7"  (1.702 m)    Growth percentile SmartLinks can only be used for patients less than 40 years old.  Physical Exam Vitals signs and nursing note reviewed.  Constitutional:      General: She is not in acute distress.    Appearance: She is well-developed.  Neck:     Thyroid: No thyromegaly.  Cardiovascular:     Rate and Rhythm: Normal rate and regular rhythm.     Heart sounds: No murmur.  Pulmonary:     Breath sounds: Normal breath sounds.  Abdominal:     General: Abdomen is scaphoid. Bowel sounds are normal. There is no distension.     Palpations: Abdomen is soft. There is no mass.     Tenderness: There is no abdominal tenderness. There is no guarding or rebound.  Musculoskeletal:     Right lower leg: No edema.     Left lower leg: No edema.  Lymphadenopathy:     Cervical: No cervical adenopathy.  Skin:    General: Skin is warm.     Findings: No rash.  Neurological:     Mental Status: She is alert.     Comments: No tremor     Assessment/Plan: 1. Elevated amylase and lipase Will repeat labs today and ensure no intrinsic liver problem or hypertriglyceridemia is contributing to the elevated amylase and lipase. Will get A1C in the setting  of ongoing zyprexa use. Discussed possibilities with patient about what could be causing, but that this could also be idiopathic and related to her DE. We will await labs and I will fax all materials to [facility] and [facility] after.  - Lipid panel - Hemoglobin A1c - Gamma GT - Amylase - Lipase

## 2018-11-10 ENCOUNTER — Other Ambulatory Visit (HOSPITAL_COMMUNITY): Payer: BLUE CROSS/BLUE SHIELD

## 2018-11-10 LAB — LIPID PANEL
Cholesterol: 163 mg/dL (ref <200)
HDL: 81 mg/dL (ref >=50)
LDL Cholesterol (Calc): 68 mg/dL (calc)
Non-HDL Cholesterol (Calc): 82 mg/dL (calc) (ref <130)
Total CHOL/HDL Ratio: 2 (calc) (ref <5.0)
Triglycerides: 55 mg/dL (ref <150)

## 2018-11-10 LAB — LIPASE: Lipase: 352 U/L — ABNORMAL HIGH (ref 7–60)

## 2018-11-10 LAB — AMYLASE: Amylase: 102 U/L — ABNORMAL HIGH (ref 21–101)

## 2018-11-10 LAB — HEMOGLOBIN A1C
Hgb A1c MFr Bld: 5.2 % of total Hgb (ref <5.7)
Mean Plasma Glucose: 103 (calc)
eAG (mmol/L): 5.7 (calc)

## 2018-11-10 LAB — GAMMA GT: GGT: 17 U/L (ref 3–40)

## 2018-11-11 ENCOUNTER — Ambulatory Visit (HOSPITAL_COMMUNITY)
Admission: RE | Admit: 2018-11-11 | Discharge: 2018-11-11 | Disposition: A | Discharge status: Home / Self Care | Payer: BLUE CROSS/BLUE SHIELD | Source: Ambulatory Visit | Attending: Pediatrics | Admitting: Pediatrics

## 2018-11-11 ENCOUNTER — Ambulatory Visit: Payer: Self-pay

## 2018-11-11 DIAGNOSIS — R630 Anorexia: Secondary | ICD-10-CM | POA: Insufficient documentation

## 2018-11-11 DIAGNOSIS — E440 Moderate protein-calorie malnutrition: Secondary | ICD-10-CM

## 2018-11-11 DIAGNOSIS — F4325 Adjustment disorder with mixed disturbance of emotions and conduct: Secondary | ICD-10-CM | POA: Diagnosis not present

## 2018-11-12 ENCOUNTER — Ambulatory Visit: Payer: BLUE CROSS/BLUE SHIELD

## 2018-11-16 DIAGNOSIS — F5000 Anorexia nervosa, unspecified: Secondary | ICD-10-CM | POA: Diagnosis not present

## 2018-11-17 DIAGNOSIS — F4325 Adjustment disorder with mixed disturbance of emotions and conduct: Secondary | ICD-10-CM | POA: Diagnosis not present

## 2018-11-23 ENCOUNTER — Ambulatory Visit (INDEPENDENT_AMBULATORY_CARE_PROVIDER_SITE_OTHER): Payer: BLUE CROSS/BLUE SHIELD | Admitting: Pediatrics

## 2018-11-23 ENCOUNTER — Encounter: Payer: Self-pay | Admitting: Pediatrics

## 2018-11-23 VITALS — BP 111/72 | HR 70 | Ht 67.0 in | Wt 109.2 lb

## 2018-11-23 DIAGNOSIS — R748 Abnormal levels of other serum enzymes: Secondary | ICD-10-CM | POA: Diagnosis not present

## 2018-11-23 DIAGNOSIS — E440 Moderate protein-calorie malnutrition: Secondary | ICD-10-CM

## 2018-11-23 DIAGNOSIS — F5001 Anorexia nervosa, restricting type: Secondary | ICD-10-CM | POA: Diagnosis not present

## 2018-11-23 DIAGNOSIS — Z1389 Encounter for screening for other disorder: Secondary | ICD-10-CM | POA: Diagnosis not present

## 2018-11-23 DIAGNOSIS — N911 Secondary amenorrhea: Secondary | ICD-10-CM

## 2018-11-23 LAB — POCT URINALYSIS DIPSTICK
Bilirubin, UA: NEGATIVE
Blood, UA: NEGATIVE
Glucose, UA: NEGATIVE
Ketones, UA: NEGATIVE
Leukocytes, UA: NEGATIVE
NITRITE UA: NEGATIVE
Protein, UA: NEGATIVE
Spec Grav, UA: 1.01 (ref 1.010–1.025)
UROBILINOGEN UA: NEGATIVE U/dL — AB
pH, UA: 7 (ref 5.0–8.0)

## 2018-11-23 NOTE — Progress Notes (Signed)
History was provided by the patient.  Carolyn Smith is a 23 y.o. female who is here for f/u [facility] discussion for anorexia.  [name], FNP   HPI:  Pt reports that she has had a hard morning after having the conversation with her dietitian this morning that HLOC is really her only option at this point. She is sad about leaving her house, job and dog for this period of time. She has not told her family the extent to which she is ill, making this even more isolation for her.   She reports her DE voice is a 10/10.   She has a work trip to [place] on Wednesday-Sunday. She wants to make sure she is cleared to go.   No LMP recorded.  Review of Systems  Constitutional: Negative for malaise/fatigue.  Eyes: Negative for double vision.  Respiratory: Negative for shortness of breath.   Cardiovascular: Negative for chest pain and palpitations.  Gastrointestinal: Negative for abdominal pain, constipation, diarrhea, nausea and vomiting.  Genitourinary: Negative for dysuria.  Musculoskeletal: Negative for joint pain and myalgias.  Skin: Negative for rash.  Neurological: Negative for dizziness and headaches.  Endo/Heme/Allergies: Does not bruise/bleed easily.  Psychiatric/Behavioral: Positive for depression. The patient is nervous/anxious.     Patient Active Problem List   Diagnosis Date Noted  . Low bone density for age 20/01/2018  . Other osteoporosis without current pathological fracture 04/22/2018  . Moderate protein-calorie malnutrition (HCC) 03/16/2018  . Underweight 01/20/2018  . Bradycardia 01/20/2018  . Secondary amenorrhea 01/20/2018  . Migraine with aura 01/20/2018  . Adjustment disorder with anxiety 09/28/2014  . Eating disorder 03/08/2014    Current Outpatient Medications on File Prior to Visit  Medication Sig Dispense Refill  . busPIRone (BUSPAR) 7.5 MG tablet Take 1 tablet (7.5 mg total) by mouth 3 (three) times daily. 90 tablet 2  . Multiple Vitamin (MULTIVITAMIN)  tablet Take 1 tablet by mouth daily.    Marland Kitchen OLANZapine (ZYPREXA) 5 MG tablet Take 1 tablet (5 mg total) by mouth at bedtime. 30 tablet 2  . venlafaxine XR (EFFEXOR XR) 150 MG 24 hr capsule Take 1 capsule (150 mg total) by mouth daily with breakfast. 30 capsule 1   No current facility-administered medications on file prior to visit.     No Known Allergies  Physical Exam:    Vitals:   11/23/18 1023  BP: 111/72  Pulse: 70  Weight: 109 lb 3.2 oz (49.5 kg)  Height: 5\' 7"  (1.702 m)    Growth percentile SmartLinks can only be used for patients less than 75 years old.  Physical Exam Vitals signs and nursing note reviewed.  Constitutional:      General: She is not in acute distress.    Appearance: She is well-developed.  Neck:     Thyroid: No thyromegaly.  Cardiovascular:     Rate and Rhythm: Normal rate and regular rhythm.     Heart sounds: No murmur.  Pulmonary:     Breath sounds: Normal breath sounds.  Abdominal:     Palpations: Abdomen is soft. There is no mass.     Tenderness: There is no abdominal tenderness. There is no guarding.  Musculoskeletal:     Right lower leg: No edema.     Left lower leg: No edema.  Lymphadenopathy:     Cervical: No cervical adenopathy.  Skin:    General: Skin is warm.     Capillary Refill: Capillary refill takes 2 to 3 seconds.  Findings: No rash.     Comments: Cool extremities  Neurological:     Mental Status: She is alert.     Comments: No tremor  Psychiatric:        Mood and Affect: Mood is anxious. Affect is tearful.     Assessment/Plan: 1. Moderate protein-calorie malnutrition (HCC) Continues to be significantly underweight. Despite good efforts outpatient, we have exhausted this level of care and it is necessary to move to residential at this time. She has been accepted by [facility] and [facility]. She is reluctant to attend, but understands the necessity. She agrees that if she hasn't talked to her family by Monday, she will  bring one of her family members to the dietitian's office. She will return here for labs next Thursday to update for admission the following week.   2. Anorexia nervosa, restricting type As above.   3. Secondary amenorrhea Persistent.   4. Elevated amylase and lipase Unclear etiology to this point- if no improvement with residential and weight gain, will likely need a GI referral.   5. Screening for genitourinary condition Some protein.  - POCT urinalysis dipstick

## 2018-11-25 ENCOUNTER — Ambulatory Visit: Payer: Self-pay | Admitting: Pediatrics

## 2018-11-26 DIAGNOSIS — F509 Eating disorder, unspecified: Secondary | ICD-10-CM | POA: Diagnosis not present

## 2018-11-26 DIAGNOSIS — F4325 Adjustment disorder with mixed disturbance of emotions and conduct: Secondary | ICD-10-CM | POA: Diagnosis not present

## 2018-12-02 DIAGNOSIS — Z713 Dietary counseling and surveillance: Secondary | ICD-10-CM | POA: Diagnosis not present

## 2018-12-03 ENCOUNTER — Ambulatory Visit: Payer: BLUE CROSS/BLUE SHIELD

## 2018-12-03 ENCOUNTER — Other Ambulatory Visit: Payer: Self-pay | Admitting: Pediatrics

## 2018-12-03 ENCOUNTER — Ambulatory Visit (INDEPENDENT_AMBULATORY_CARE_PROVIDER_SITE_OTHER): Payer: BLUE CROSS/BLUE SHIELD

## 2018-12-03 VITALS — BP 112/79 | HR 86 | Ht 67.32 in | Wt 107.6 lb

## 2018-12-03 DIAGNOSIS — F5001 Anorexia nervosa, restricting type: Secondary | ICD-10-CM

## 2018-12-03 DIAGNOSIS — F5000 Anorexia nervosa, unspecified: Secondary | ICD-10-CM | POA: Diagnosis not present

## 2018-12-03 DIAGNOSIS — E440 Moderate protein-calorie malnutrition: Secondary | ICD-10-CM

## 2018-12-03 NOTE — Progress Notes (Signed)
Patient came in for labs Amylase, CMP, CBC with diff, Phosphorus, and Magnesium and Lipase. Labs ordered by Alfonso Ramus. Successful collection.

## 2018-12-03 NOTE — Progress Notes (Signed)
Pt here today for vitals check. Collaborated with NP- plan of care made.  ? ?

## 2018-12-04 DIAGNOSIS — H52203 Unspecified astigmatism, bilateral: Secondary | ICD-10-CM | POA: Diagnosis not present

## 2018-12-04 DIAGNOSIS — H10413 Chronic giant papillary conjunctivitis, bilateral: Secondary | ICD-10-CM | POA: Diagnosis not present

## 2018-12-04 LAB — PHOSPHORUS: Phosphorus: 4.3 mg/dL (ref 2.5–4.5)

## 2018-12-04 LAB — COMPREHENSIVE METABOLIC PANEL
AG Ratio: 2 (calc) (ref 1.0–2.5)
ALT: 27 U/L (ref 6–29)
AST: 31 U/L — AB (ref 10–30)
Albumin: 4.8 g/dL (ref 3.6–5.1)
Alkaline phosphatase (APISO): 73 U/L (ref 31–125)
BUN: 16 mg/dL (ref 7–25)
CO2: 32 mmol/L (ref 20–32)
Calcium: 10.5 mg/dL — ABNORMAL HIGH (ref 8.6–10.2)
Chloride: 103 mmol/L (ref 98–110)
Creat: 0.7 mg/dL (ref 0.50–1.10)
GLOBULIN: 2.4 g/dL (ref 1.9–3.7)
Glucose, Bld: 100 mg/dL — ABNORMAL HIGH (ref 65–99)
Potassium: 5.4 mmol/L — ABNORMAL HIGH (ref 3.5–5.3)
Sodium: 140 mmol/L (ref 135–146)
Total Bilirubin: 0.4 mg/dL (ref 0.2–1.2)
Total Protein: 7.2 g/dL (ref 6.1–8.1)

## 2018-12-04 LAB — LIPASE: Lipase: 80 U/L — ABNORMAL HIGH (ref 7–60)

## 2018-12-04 LAB — CBC WITH DIFFERENTIAL/PLATELET
Absolute Monocytes: 374 cells/uL (ref 200–950)
Basophils Absolute: 41 cells/uL (ref 0–200)
Basophils Relative: 0.9 %
EOS PCT: 0 %
Eosinophils Absolute: 0 cells/uL — ABNORMAL LOW (ref 15–500)
HCT: 43.3 % (ref 35.0–45.0)
Hemoglobin: 14.8 g/dL (ref 11.7–15.5)
Lymphs Abs: 1013 cells/uL (ref 850–3900)
MCH: 31.8 pg (ref 27.0–33.0)
MCHC: 34.2 g/dL (ref 32.0–36.0)
MCV: 93.1 fL (ref 80.0–100.0)
MPV: 11.4 fL (ref 7.5–12.5)
Monocytes Relative: 8.3 %
Neutro Abs: 3074 cells/uL (ref 1500–7800)
Neutrophils Relative %: 68.3 %
PLATELETS: 274 10*3/uL (ref 140–400)
RBC: 4.65 10*6/uL (ref 3.80–5.10)
RDW: 11.6 % (ref 11.0–15.0)
Total Lymphocyte: 22.5 %
WBC: 4.5 10*3/uL (ref 3.8–10.8)

## 2018-12-04 LAB — MAGNESIUM: Magnesium: 2.2 mg/dL (ref 1.5–2.5)

## 2018-12-04 LAB — AMYLASE: Amylase: 51 U/L (ref 21–101)

## 2018-12-16 DIAGNOSIS — F5001 Anorexia nervosa, restricting type: Secondary | ICD-10-CM | POA: Diagnosis not present

## 2018-12-16 DIAGNOSIS — Z1321 Encounter for screening for nutritional disorder: Secondary | ICD-10-CM | POA: Diagnosis not present

## 2018-12-16 DIAGNOSIS — Z1329 Encounter for screening for other suspected endocrine disorder: Secondary | ICD-10-CM | POA: Diagnosis not present

## 2018-12-17 DIAGNOSIS — Z1321 Encounter for screening for nutritional disorder: Secondary | ICD-10-CM | POA: Diagnosis not present

## 2018-12-17 DIAGNOSIS — F5001 Anorexia nervosa, restricting type: Secondary | ICD-10-CM | POA: Diagnosis not present

## 2018-12-18 ENCOUNTER — Other Ambulatory Visit: Payer: Self-pay | Admitting: Pediatrics

## 2018-12-20 DIAGNOSIS — F5001 Anorexia nervosa, restricting type: Secondary | ICD-10-CM | POA: Diagnosis not present

## 2018-12-20 DIAGNOSIS — Z1321 Encounter for screening for nutritional disorder: Secondary | ICD-10-CM | POA: Diagnosis not present

## 2018-12-20 DIAGNOSIS — Z131 Encounter for screening for diabetes mellitus: Secondary | ICD-10-CM | POA: Diagnosis not present

## 2018-12-24 DIAGNOSIS — F5001 Anorexia nervosa, restricting type: Secondary | ICD-10-CM | POA: Diagnosis not present

## 2018-12-24 DIAGNOSIS — Z79899 Other long term (current) drug therapy: Secondary | ICD-10-CM | POA: Diagnosis not present

## 2018-12-24 DIAGNOSIS — R749 Abnormal serum enzyme level, unspecified: Secondary | ICD-10-CM | POA: Diagnosis not present

## 2018-12-24 DIAGNOSIS — Z1321 Encounter for screening for nutritional disorder: Secondary | ICD-10-CM | POA: Diagnosis not present

## 2018-12-24 DIAGNOSIS — Z1329 Encounter for screening for other suspected endocrine disorder: Secondary | ICD-10-CM | POA: Diagnosis not present

## 2018-12-30 DIAGNOSIS — F5001 Anorexia nervosa, restricting type: Secondary | ICD-10-CM | POA: Diagnosis not present

## 2019-01-06 DIAGNOSIS — Z1329 Encounter for screening for other suspected endocrine disorder: Secondary | ICD-10-CM | POA: Diagnosis not present

## 2019-01-06 DIAGNOSIS — F5001 Anorexia nervosa, restricting type: Secondary | ICD-10-CM | POA: Diagnosis not present

## 2019-01-06 DIAGNOSIS — Z1321 Encounter for screening for nutritional disorder: Secondary | ICD-10-CM | POA: Diagnosis not present

## 2019-01-13 DIAGNOSIS — F5002 Anorexia nervosa, binge eating/purging type: Secondary | ICD-10-CM | POA: Diagnosis not present

## 2019-01-20 DIAGNOSIS — F5001 Anorexia nervosa, restricting type: Secondary | ICD-10-CM | POA: Diagnosis not present

## 2019-01-27 DIAGNOSIS — F5001 Anorexia nervosa, restricting type: Secondary | ICD-10-CM | POA: Diagnosis not present

## 2019-02-03 DIAGNOSIS — F5001 Anorexia nervosa, restricting type: Secondary | ICD-10-CM | POA: Diagnosis not present

## 2019-02-11 DIAGNOSIS — F5001 Anorexia nervosa, restricting type: Secondary | ICD-10-CM | POA: Diagnosis not present

## 2019-02-17 DIAGNOSIS — F5001 Anorexia nervosa, restricting type: Secondary | ICD-10-CM | POA: Diagnosis not present

## 2019-02-24 DIAGNOSIS — F5001 Anorexia nervosa, restricting type: Secondary | ICD-10-CM | POA: Diagnosis not present

## 2019-03-08 DIAGNOSIS — F5001 Anorexia nervosa, restricting type: Secondary | ICD-10-CM | POA: Diagnosis not present

## 2019-03-09 DIAGNOSIS — F5001 Anorexia nervosa, restricting type: Secondary | ICD-10-CM | POA: Diagnosis not present

## 2019-03-10 DIAGNOSIS — F5001 Anorexia nervosa, restricting type: Secondary | ICD-10-CM | POA: Diagnosis not present

## 2019-03-11 DIAGNOSIS — F5001 Anorexia nervosa, restricting type: Secondary | ICD-10-CM | POA: Diagnosis not present

## 2019-03-12 DIAGNOSIS — F5001 Anorexia nervosa, restricting type: Secondary | ICD-10-CM | POA: Diagnosis not present

## 2019-03-13 DIAGNOSIS — F5001 Anorexia nervosa, restricting type: Secondary | ICD-10-CM | POA: Diagnosis not present

## 2019-03-14 DIAGNOSIS — F5001 Anorexia nervosa, restricting type: Secondary | ICD-10-CM | POA: Diagnosis not present

## 2019-03-15 DIAGNOSIS — F5001 Anorexia nervosa, restricting type: Secondary | ICD-10-CM | POA: Diagnosis not present

## 2019-03-16 DIAGNOSIS — F5001 Anorexia nervosa, restricting type: Secondary | ICD-10-CM | POA: Diagnosis not present

## 2019-03-17 DIAGNOSIS — F5001 Anorexia nervosa, restricting type: Secondary | ICD-10-CM | POA: Diagnosis not present

## 2019-03-18 DIAGNOSIS — F5001 Anorexia nervosa, restricting type: Secondary | ICD-10-CM | POA: Diagnosis not present

## 2019-03-19 DIAGNOSIS — F5001 Anorexia nervosa, restricting type: Secondary | ICD-10-CM | POA: Diagnosis not present

## 2019-03-20 DIAGNOSIS — F5001 Anorexia nervosa, restricting type: Secondary | ICD-10-CM | POA: Diagnosis not present

## 2019-03-21 DIAGNOSIS — F5001 Anorexia nervosa, restricting type: Secondary | ICD-10-CM | POA: Diagnosis not present

## 2019-03-22 DIAGNOSIS — F5001 Anorexia nervosa, restricting type: Secondary | ICD-10-CM | POA: Diagnosis not present

## 2019-03-23 DIAGNOSIS — F5001 Anorexia nervosa, restricting type: Secondary | ICD-10-CM | POA: Diagnosis not present

## 2019-03-24 DIAGNOSIS — F5001 Anorexia nervosa, restricting type: Secondary | ICD-10-CM | POA: Diagnosis not present

## 2019-03-25 DIAGNOSIS — F5001 Anorexia nervosa, restricting type: Secondary | ICD-10-CM | POA: Diagnosis not present

## 2019-03-26 DIAGNOSIS — F5001 Anorexia nervosa, restricting type: Secondary | ICD-10-CM | POA: Diagnosis not present

## 2019-03-27 DIAGNOSIS — F5001 Anorexia nervosa, restricting type: Secondary | ICD-10-CM | POA: Diagnosis not present

## 2019-03-28 DIAGNOSIS — F5001 Anorexia nervosa, restricting type: Secondary | ICD-10-CM | POA: Diagnosis not present

## 2019-03-29 DIAGNOSIS — F5001 Anorexia nervosa, restricting type: Secondary | ICD-10-CM | POA: Diagnosis not present

## 2019-03-30 DIAGNOSIS — F5001 Anorexia nervosa, restricting type: Secondary | ICD-10-CM | POA: Diagnosis not present

## 2019-04-01 DIAGNOSIS — F5001 Anorexia nervosa, restricting type: Secondary | ICD-10-CM | POA: Diagnosis not present

## 2019-04-02 DIAGNOSIS — F5001 Anorexia nervosa, restricting type: Secondary | ICD-10-CM | POA: Diagnosis not present

## 2019-04-03 DIAGNOSIS — F5001 Anorexia nervosa, restricting type: Secondary | ICD-10-CM | POA: Diagnosis not present

## 2019-04-04 DIAGNOSIS — F5001 Anorexia nervosa, restricting type: Secondary | ICD-10-CM | POA: Diagnosis not present

## 2019-04-05 DIAGNOSIS — F5001 Anorexia nervosa, restricting type: Secondary | ICD-10-CM | POA: Diagnosis not present

## 2019-04-06 DIAGNOSIS — F5001 Anorexia nervosa, restricting type: Secondary | ICD-10-CM | POA: Diagnosis not present

## 2019-04-07 DIAGNOSIS — F5001 Anorexia nervosa, restricting type: Secondary | ICD-10-CM | POA: Diagnosis not present

## 2019-04-08 DIAGNOSIS — F5001 Anorexia nervosa, restricting type: Secondary | ICD-10-CM | POA: Diagnosis not present

## 2019-04-09 DIAGNOSIS — F5001 Anorexia nervosa, restricting type: Secondary | ICD-10-CM | POA: Diagnosis not present

## 2019-04-10 DIAGNOSIS — F5001 Anorexia nervosa, restricting type: Secondary | ICD-10-CM | POA: Diagnosis not present

## 2019-04-11 DIAGNOSIS — F5001 Anorexia nervosa, restricting type: Secondary | ICD-10-CM | POA: Diagnosis not present

## 2019-04-12 DIAGNOSIS — F5001 Anorexia nervosa, restricting type: Secondary | ICD-10-CM | POA: Diagnosis not present

## 2019-04-13 DIAGNOSIS — F5001 Anorexia nervosa, restricting type: Secondary | ICD-10-CM | POA: Diagnosis not present

## 2019-04-14 DIAGNOSIS — F5001 Anorexia nervosa, restricting type: Secondary | ICD-10-CM | POA: Diagnosis not present

## 2019-04-15 DIAGNOSIS — F5001 Anorexia nervosa, restricting type: Secondary | ICD-10-CM | POA: Diagnosis not present

## 2019-04-16 DIAGNOSIS — F5001 Anorexia nervosa, restricting type: Secondary | ICD-10-CM | POA: Diagnosis not present

## 2019-04-19 DIAGNOSIS — F5001 Anorexia nervosa, restricting type: Secondary | ICD-10-CM | POA: Diagnosis not present

## 2019-04-20 DIAGNOSIS — F5001 Anorexia nervosa, restricting type: Secondary | ICD-10-CM | POA: Diagnosis not present

## 2019-04-21 DIAGNOSIS — F5001 Anorexia nervosa, restricting type: Secondary | ICD-10-CM | POA: Diagnosis not present

## 2019-04-22 DIAGNOSIS — F5001 Anorexia nervosa, restricting type: Secondary | ICD-10-CM | POA: Diagnosis not present

## 2019-04-22 DIAGNOSIS — F411 Generalized anxiety disorder: Secondary | ICD-10-CM | POA: Diagnosis not present

## 2019-04-23 DIAGNOSIS — F5001 Anorexia nervosa, restricting type: Secondary | ICD-10-CM | POA: Diagnosis not present

## 2019-04-26 DIAGNOSIS — F5001 Anorexia nervosa, restricting type: Secondary | ICD-10-CM | POA: Diagnosis not present

## 2019-04-27 DIAGNOSIS — F5001 Anorexia nervosa, restricting type: Secondary | ICD-10-CM | POA: Diagnosis not present

## 2019-04-29 DIAGNOSIS — Z713 Dietary counseling and surveillance: Secondary | ICD-10-CM | POA: Diagnosis not present

## 2019-04-29 DIAGNOSIS — F5001 Anorexia nervosa, restricting type: Secondary | ICD-10-CM | POA: Diagnosis not present

## 2019-04-30 DIAGNOSIS — F5000 Anorexia nervosa, unspecified: Secondary | ICD-10-CM | POA: Diagnosis not present

## 2019-05-03 DIAGNOSIS — F5001 Anorexia nervosa, restricting type: Secondary | ICD-10-CM | POA: Diagnosis not present

## 2019-05-04 DIAGNOSIS — F5001 Anorexia nervosa, restricting type: Secondary | ICD-10-CM | POA: Diagnosis not present

## 2019-05-06 DIAGNOSIS — F5001 Anorexia nervosa, restricting type: Secondary | ICD-10-CM | POA: Diagnosis not present

## 2019-05-10 DIAGNOSIS — F5001 Anorexia nervosa, restricting type: Secondary | ICD-10-CM | POA: Diagnosis not present

## 2019-05-11 DIAGNOSIS — F5001 Anorexia nervosa, restricting type: Secondary | ICD-10-CM | POA: Diagnosis not present

## 2019-05-13 DIAGNOSIS — Z713 Dietary counseling and surveillance: Secondary | ICD-10-CM | POA: Diagnosis not present

## 2019-05-13 DIAGNOSIS — F5001 Anorexia nervosa, restricting type: Secondary | ICD-10-CM | POA: Diagnosis not present

## 2019-05-14 DIAGNOSIS — F5000 Anorexia nervosa, unspecified: Secondary | ICD-10-CM | POA: Diagnosis not present

## 2019-05-17 ENCOUNTER — Ambulatory Visit (INDEPENDENT_AMBULATORY_CARE_PROVIDER_SITE_OTHER): Payer: BC Managed Care – PPO | Admitting: Family

## 2019-05-17 ENCOUNTER — Encounter: Payer: Self-pay | Admitting: Family

## 2019-05-17 DIAGNOSIS — M9905 Segmental and somatic dysfunction of pelvic region: Secondary | ICD-10-CM | POA: Diagnosis not present

## 2019-05-17 DIAGNOSIS — F5001 Anorexia nervosa, restricting type: Secondary | ICD-10-CM

## 2019-05-17 DIAGNOSIS — M9904 Segmental and somatic dysfunction of sacral region: Secondary | ICD-10-CM | POA: Diagnosis not present

## 2019-05-17 DIAGNOSIS — G43109 Migraine with aura, not intractable, without status migrainosus: Secondary | ICD-10-CM

## 2019-05-17 DIAGNOSIS — F4322 Adjustment disorder with anxiety: Secondary | ICD-10-CM | POA: Diagnosis not present

## 2019-05-17 DIAGNOSIS — M7918 Myalgia, other site: Secondary | ICD-10-CM | POA: Diagnosis not present

## 2019-05-17 DIAGNOSIS — M9903 Segmental and somatic dysfunction of lumbar region: Secondary | ICD-10-CM | POA: Diagnosis not present

## 2019-05-17 DIAGNOSIS — Z3009 Encounter for other general counseling and advice on contraception: Secondary | ICD-10-CM

## 2019-05-17 MED ORDER — VENLAFAXINE HCL ER 150 MG PO CP24: 150.0000 mg | ORAL_CAPSULE | Freq: Every day | ORAL | 0 refills | Status: DC

## 2019-05-17 MED ORDER — ARIPIPRAZOLE 5 MG PO TABS: 5.0000 mg | ORAL_TABLET | Freq: Every day | ORAL | 0 refills | Status: DC

## 2019-05-17 NOTE — Progress Notes (Signed)
Virtual Visit via Video Note  I connected with Carolyn Smith  on 05/17/19 at 11:00 AM EDT by a video enabled telemedicine application and verified that I am speaking with the correct person using two identifiers.   Location of patient/parent: home   I discussed the limitations of evaluation and management by telemedicine and the availability of in person appointments.  I discussed that the purpose of this telehealth visit is to provide medical care while limiting exposure to the novel coronavirus.  The patient expressed understanding and agreed to proceed.  Reason for visit/Chief Complaint  -anorexia nervosa, restricting type and questions about birth control   History of Present Illness:  -no buspar  -6 on scale of 1-10  -meal plan regimen: dietician: meghan hadley; heather kitchen, virtual [place] looking at end of August  -PHP: pyschiatrist - was prescribing her Abilify 5mg    -her most recent monitorig labs were about a month ago  -interested in birth control, using condoms currently, period has returned - bleeds monthly, 8 days; has migraine with aura; never used any form of birth control except for condoms;    Observations/Objective: pleasant, sitting upright, NAD, no WOB; no pallor, no temporal wasting, no rashes or skin lesions visible   Assessment and Plan:  1. Anorexia nervosa, restricting type -continue with IOP, treatment team  -will refer to adult care for ongoing management as she has aged out of our practice; discussed making this transition prior to her next monitoring labs  2. Migraine with aura and without status migrainosus, not intractable -discussed absolute contraindication for estrogen-containing methods; she understood; is pre-contemplative for LARC   3. Birth control counseling -reviewed progesterone-only options, emphasizing that she cannot take estrogen-containing products d/t migraine with aura; reviewed IUD, implant and depo; discussed  bleeding profiles and side effects of each method; gave her [email or web address] for more information; she will send my chart and I will refer her to GYN for follow-up. She is considering implant or iud at this point.  -reviewed EC and condom use    Follow Up Instructions: pending My Chart, will refer her to GYN for birth control; will find good fit for referral to adult care.    I discussed the assessment and treatment plan with the patient and/or parent/guardian. They were provided an opportunity to ask questions and all were answered. They agreed with the plan and demonstrated an understanding of the instructions.   They were advised to call back or seek an in-person evaluation in the emergency room if the symptoms worsen or if the condition fails to improve as anticipated.  I spent 28 minutes on this telehealth visit inclusive of face-to-face video and care coordination time I was located remote during this encounter.  Parthenia Ames, NP

## 2019-05-18 DIAGNOSIS — F5001 Anorexia nervosa, restricting type: Secondary | ICD-10-CM | POA: Diagnosis not present

## 2019-05-20 DIAGNOSIS — F5001 Anorexia nervosa, restricting type: Secondary | ICD-10-CM | POA: Diagnosis not present

## 2019-05-21 DIAGNOSIS — M9905 Segmental and somatic dysfunction of pelvic region: Secondary | ICD-10-CM | POA: Diagnosis not present

## 2019-05-21 DIAGNOSIS — M9904 Segmental and somatic dysfunction of sacral region: Secondary | ICD-10-CM | POA: Diagnosis not present

## 2019-05-21 DIAGNOSIS — F5000 Anorexia nervosa, unspecified: Secondary | ICD-10-CM | POA: Diagnosis not present

## 2019-05-21 DIAGNOSIS — M9903 Segmental and somatic dysfunction of lumbar region: Secondary | ICD-10-CM | POA: Diagnosis not present

## 2019-05-21 DIAGNOSIS — M7918 Myalgia, other site: Secondary | ICD-10-CM | POA: Diagnosis not present

## 2019-05-24 DIAGNOSIS — F5001 Anorexia nervosa, restricting type: Secondary | ICD-10-CM | POA: Diagnosis not present

## 2019-05-25 DIAGNOSIS — F5001 Anorexia nervosa, restricting type: Secondary | ICD-10-CM | POA: Diagnosis not present

## 2019-05-25 DIAGNOSIS — Z713 Dietary counseling and surveillance: Secondary | ICD-10-CM | POA: Diagnosis not present

## 2019-05-26 DIAGNOSIS — M7918 Myalgia, other site: Secondary | ICD-10-CM | POA: Diagnosis not present

## 2019-05-26 DIAGNOSIS — M9905 Segmental and somatic dysfunction of pelvic region: Secondary | ICD-10-CM | POA: Diagnosis not present

## 2019-05-26 DIAGNOSIS — M9903 Segmental and somatic dysfunction of lumbar region: Secondary | ICD-10-CM | POA: Diagnosis not present

## 2019-05-26 DIAGNOSIS — M9904 Segmental and somatic dysfunction of sacral region: Secondary | ICD-10-CM | POA: Diagnosis not present

## 2019-05-27 DIAGNOSIS — F5001 Anorexia nervosa, restricting type: Secondary | ICD-10-CM | POA: Diagnosis not present

## 2019-05-31 DIAGNOSIS — F5001 Anorexia nervosa, restricting type: Secondary | ICD-10-CM | POA: Diagnosis not present

## 2019-06-01 DIAGNOSIS — F5000 Anorexia nervosa, unspecified: Secondary | ICD-10-CM | POA: Diagnosis not present

## 2019-06-02 DIAGNOSIS — M9904 Segmental and somatic dysfunction of sacral region: Secondary | ICD-10-CM | POA: Diagnosis not present

## 2019-06-02 DIAGNOSIS — M7918 Myalgia, other site: Secondary | ICD-10-CM | POA: Diagnosis not present

## 2019-06-02 DIAGNOSIS — M9905 Segmental and somatic dysfunction of pelvic region: Secondary | ICD-10-CM | POA: Diagnosis not present

## 2019-06-02 DIAGNOSIS — M9903 Segmental and somatic dysfunction of lumbar region: Secondary | ICD-10-CM | POA: Diagnosis not present

## 2019-06-07 ENCOUNTER — Encounter: Payer: Self-pay | Admitting: Family

## 2019-06-08 ENCOUNTER — Other Ambulatory Visit: Payer: Self-pay | Admitting: Family

## 2019-06-08 DIAGNOSIS — F5000 Anorexia nervosa, unspecified: Secondary | ICD-10-CM | POA: Diagnosis not present

## 2019-06-08 DIAGNOSIS — F4322 Adjustment disorder with anxiety: Secondary | ICD-10-CM

## 2019-06-08 MED ORDER — VENLAFAXINE HCL ER 150 MG PO CP24: 150.0000 mg | ORAL_CAPSULE | Freq: Every day | ORAL | 0 refills | Status: DC

## 2019-06-10 ENCOUNTER — Encounter: Payer: Self-pay | Admitting: Family

## 2019-06-10 ENCOUNTER — Other Ambulatory Visit: Payer: Self-pay

## 2019-06-10 ENCOUNTER — Ambulatory Visit (INDEPENDENT_AMBULATORY_CARE_PROVIDER_SITE_OTHER): Payer: BC Managed Care – PPO | Admitting: Family

## 2019-06-10 VITALS — BP 101/70 | HR 83 | Ht 67.72 in | Wt 128.8 lb

## 2019-06-10 DIAGNOSIS — Z3202 Encounter for pregnancy test, result negative: Secondary | ICD-10-CM

## 2019-06-10 DIAGNOSIS — F5000 Anorexia nervosa, unspecified: Secondary | ICD-10-CM | POA: Diagnosis not present

## 2019-06-10 DIAGNOSIS — Z30017 Encounter for initial prescription of implantable subdermal contraceptive: Secondary | ICD-10-CM | POA: Diagnosis not present

## 2019-06-10 DIAGNOSIS — Z113 Encounter for screening for infections with a predominantly sexual mode of transmission: Secondary | ICD-10-CM

## 2019-06-10 LAB — POCT URINE PREGNANCY: Preg Test, Ur: NEGATIVE

## 2019-06-10 NOTE — Progress Notes (Signed)
History was provided by the patient.  Carolyn Smith is a 23 y.o. female who is here for nexplanon insertion.   PCP confirmed? Yes.    Trude Mcburney, FNP  HPI:   -desires nexplanon insertion per her last video visit  -no questions prior to insertion  -reviewed that 7 days before effective   Review of Systems  Constitutional: Negative for chills and fever.  HENT: Negative for congestion and sore throat.   Respiratory: Negative for cough and shortness of breath.   Cardiovascular: Negative for chest pain and palpitations.  Gastrointestinal: Negative for abdominal pain.  Genitourinary: Negative for dysuria and frequency.  Musculoskeletal: Negative for myalgias.  Skin: Negative for rash.  Endo/Heme/Allergies: Does not bruise/bleed easily.  Psychiatric/Behavioral: The patient is not nervous/anxious.     Patient Active Problem List   Diagnosis Date Noted  . Elevated amylase and lipase 11/23/2018  . Low bone density for age 36/01/2018  . Other osteoporosis without current pathological fracture 04/22/2018  . Moderate protein-calorie malnutrition ([place]) 03/16/2018  . Underweight 01/20/2018  . Bradycardia 01/20/2018  . Secondary amenorrhea 01/20/2018  . Migraine with aura 01/20/2018  . Adjustment disorder with anxiety 09/28/2014  . Eating disorder 03/08/2014    Current Outpatient Medications on File Prior to Visit  Medication Sig Dispense Refill  . ARIPiprazole (ABILIFY) 5 MG tablet Take 1 tablet (5 mg total) by mouth daily. 90 tablet 0  . clindamycin (CLEOCIN T) 1 % external solution     . Multiple Vitamin (MULTIVITAMIN) tablet Take 1 tablet by mouth daily.    Marland Kitchen venlafaxine XR (EFFEXOR XR) 150 MG 24 hr capsule Take 1 capsule (150 mg total) by mouth daily with breakfast. 90 capsule 0   No current facility-administered medications on file prior to visit.     No Known Allergies  Physical Exam:    Vitals:   06/10/19 1040  BP: 101/70  Pulse: 83  Weight: 128 lb 12.8 oz  (58.4 kg)  Height: 5' 7.72" (1.72 m)    Growth percentile SmartLinks can only be used for patients less than 41 years old. No LMP recorded.  Physical Exam Constitutional:      Appearance: Normal appearance.  HENT:     Head: Normocephalic.  Eyes:     Extraocular Movements: Extraocular movements intact.     Pupils: Pupils are equal, round, and reactive to light.  Neck:     Musculoskeletal: Normal range of motion.  Cardiovascular:     Rate and Rhythm: Regular rhythm.     Heart sounds: No murmur.  Pulmonary:     Effort: Pulmonary effort is normal.  Abdominal:     General: Abdomen is flat.  Musculoskeletal: Normal range of motion.        General: No swelling.  Lymphadenopathy:     Cervical: No cervical adenopathy.  Skin:    General: Skin is warm and dry.  Neurological:     General: No focal deficit present.     Mental Status: She is alert and oriented to person, place, and time.  Psychiatric:        Mood and Affect: Mood normal.      Assessment/Plan: 1. Encounter for initial prescription of Nexplanon -see procedure note  - Subdermal Etonogestrel Implant Insertion - etonogestrel (NEXPLANON) implant 68 mg  2. Pregnancy examination or test, negative result -negative - POCT urine pregnancy  3. Routine screening for STI (sexually transmitted infection) -per protocol  - C. trachomatis/N. gonorrhoeae RNA  nexplanon insertion  [id number]  lot EXp [phone]

## 2019-06-11 DIAGNOSIS — M9903 Segmental and somatic dysfunction of lumbar region: Secondary | ICD-10-CM | POA: Diagnosis not present

## 2019-06-11 DIAGNOSIS — M9905 Segmental and somatic dysfunction of pelvic region: Secondary | ICD-10-CM | POA: Diagnosis not present

## 2019-06-11 DIAGNOSIS — M7918 Myalgia, other site: Secondary | ICD-10-CM | POA: Diagnosis not present

## 2019-06-11 DIAGNOSIS — M9904 Segmental and somatic dysfunction of sacral region: Secondary | ICD-10-CM | POA: Diagnosis not present

## 2019-06-12 ENCOUNTER — Encounter: Payer: Self-pay | Admitting: Family

## 2019-06-12 LAB — C. TRACHOMATIS/N. GONORRHOEAE RNA
C. trachomatis RNA, TMA: NOT DETECTED
N. gonorrhoeae RNA, TMA: NOT DETECTED

## 2019-06-12 MED ORDER — ETONOGESTREL 68 MG ~~LOC~~ IMPL
68.0000 mg | DRUG_IMPLANT | Freq: Once | SUBCUTANEOUS | Status: AC
  Administered 2019-06-10: 68 mg via SUBCUTANEOUS

## 2019-06-12 NOTE — Procedures (Signed)
Nexplanon Insertion  No contraindications for placement.  No liver disease, no unexplained vaginal bleeding, no h/o breast cancer, no h/o blood clots.  No LMP recorded.  UHCG: negative  Last Unprotected sex:  NA  Risks & benefits of Nexplanon discussed The nexplanon device was purchased and supplied by [facility]. Packaging instructions supplied to patient Consent form signed  The patient denies any allergies to anesthetics or antiseptics.  Procedure: Pt was placed in supine position. The left arm was flexed at the elbow and externally rotated so that her wrist was parallel to her ear The medial epicondyle of the left arm was identified The insertions site was marked 8 cm proximal to the medial epicondyle The insertion site was cleaned with Betadine The area surrounding the insertion site was covered with a sterile drape 1% lidocaine was injected just under the skin at the insertion site extending 4 cm proximally. The sterile preloaded disposable Nexaplanon applicator was removed from the sterile packaging The applicator needle was inserted at a 30 degree angle at 8 cm proximal to the medial epicondyle as marked The applicator was lowered to a horizontal position and advanced just under the skin for the full length of the needle The slider on the applicator was retracted fully while the applicator remained in the same position, then the applicator was removed. The implant was confirmed via palpation as being in position The implant position was demonstrated to the patient Pressure dressing was applied to the patient.  The patient was instructed to removed the pressure dressing in 24 hrs.  The patient was advised to move slowly from a supine to an upright position  The patient denied any concerns or complaints  The patient was instructed to schedule a follow-up appt in 1 month and to call sooner if any concerns.  The patient acknowledged agreement and understanding of the plan.  

## 2019-06-17 DIAGNOSIS — Z713 Dietary counseling and surveillance: Secondary | ICD-10-CM | POA: Diagnosis not present

## 2019-06-22 DIAGNOSIS — F5000 Anorexia nervosa, unspecified: Secondary | ICD-10-CM | POA: Diagnosis not present

## 2019-06-24 DIAGNOSIS — Z713 Dietary counseling and surveillance: Secondary | ICD-10-CM | POA: Diagnosis not present

## 2019-07-01 DIAGNOSIS — F5000 Anorexia nervosa, unspecified: Secondary | ICD-10-CM | POA: Diagnosis not present

## 2019-07-02 DIAGNOSIS — M7918 Myalgia, other site: Secondary | ICD-10-CM | POA: Diagnosis not present

## 2019-07-02 DIAGNOSIS — M9904 Segmental and somatic dysfunction of sacral region: Secondary | ICD-10-CM | POA: Diagnosis not present

## 2019-07-02 DIAGNOSIS — M9905 Segmental and somatic dysfunction of pelvic region: Secondary | ICD-10-CM | POA: Diagnosis not present

## 2019-07-02 DIAGNOSIS — M9903 Segmental and somatic dysfunction of lumbar region: Secondary | ICD-10-CM | POA: Diagnosis not present

## 2019-07-02 DIAGNOSIS — F5000 Anorexia nervosa, unspecified: Secondary | ICD-10-CM | POA: Diagnosis not present

## 2019-07-07 ENCOUNTER — Ambulatory Visit: Payer: Self-pay | Admitting: Pediatrics

## 2019-07-08 ENCOUNTER — Ambulatory Visit (INDEPENDENT_AMBULATORY_CARE_PROVIDER_SITE_OTHER): Payer: BC Managed Care – PPO | Admitting: Pediatrics

## 2019-07-08 DIAGNOSIS — M8580 Other specified disorders of bone density and structure, unspecified site: Secondary | ICD-10-CM | POA: Diagnosis not present

## 2019-07-08 DIAGNOSIS — F5001 Anorexia nervosa, restricting type: Secondary | ICD-10-CM

## 2019-07-08 DIAGNOSIS — Z713 Dietary counseling and surveillance: Secondary | ICD-10-CM | POA: Diagnosis not present

## 2019-07-08 DIAGNOSIS — F4322 Adjustment disorder with anxiety: Secondary | ICD-10-CM | POA: Diagnosis not present

## 2019-07-08 DIAGNOSIS — M859 Disorder of bone density and structure, unspecified: Secondary | ICD-10-CM

## 2019-07-08 DIAGNOSIS — F50019 Anorexia nervosa, restricting type, unspecified: Secondary | ICD-10-CM

## 2019-07-08 NOTE — Progress Notes (Signed)
THIS RECORD MAY CONTAIN CONFIDENTIAL INFORMATION THAT SHOULD NOT BE RELEASED WITHOUT REVIEW OF THE SERVICE PROVIDER.  Virtual Follow-Up Visit via Video Note  I connected with Carolyn Smith 's patient  on 07/08/19 at  2:00 PM EDT by a video enabled telemedicine application and verified that I am speaking with the correct person using two identifiers.    This patient visit was completed through the use of an audio/video or telephone encounter in the setting of the State of Emergency due to the COVID-19 Pandemic.  I discussed that the purpose of this telehealth visit is to provide medical care while limiting exposure to the novel coronavirus.       I discussed the limitations of evaluation and management by telemedicine and the availability of in person appointments.    The patient expressed understanding and agreed to proceed.   The patient was physically located at home in [place] or a state in which I am permitted to provide care. The patient and/or parent/guardian understood that s/he may incur co-pays and cost sharing, and agreed to the telemedicine visit. The visit was reasonable and appropriate under the circumstances given the patient's presentation at the time.   The patient and/or parent/guardian has been advised of the potential risks and limitations of this mode of treatment (including, but not limited to, the absence of in-person examination) and has agreed to be treated using telemedicine. The patient's/patient's family's questions regarding telemedicine have been answered.    As this visit was completed in an ambulatory virtual setting, the patient and/or parent/guardian has also been advised to contact their provider's office for worsening conditions, and seek emergency medical treatment and/or call 911 if the patient deems either necessary.    Carolyn Smith is a 23 y.o. female referred by Trude Mcburney, FNP here today for follow-up of anorexia, anxiety, depression,  nexplanon.   Growth Chart Viewed? not applicable  Previsit planning completed:  yes   History was provided by the patient.  PCP Confirmed?  yes  My Chart Activated?   no    Plan from Last Visit:   nexplanon inserted  Chief Complaint: Follow up eating disorder, anxiety  History of Present Illness:  Feels like overall things are going really well right now. She continues to see her care team weekly post inpatinet, php and iop. She feels like her intake is good and she is walking her dog and doing yoga about once a week. She is now back to work as well.   No concerns with nexplanon. Has had one period that was fairly normal, maybe a little heavier than previously. No btb currently.   Has not yet connected with any adult providers- awaiting our recommendations.    No LMP recorded.  Review of Systems  Constitutional: Negative for malaise/fatigue.  Eyes: Negative for double vision.  Respiratory: Negative for shortness of breath.   Cardiovascular: Negative for chest pain and palpitations.  Gastrointestinal: Negative for abdominal pain, constipation, diarrhea, nausea and vomiting.  Genitourinary: Negative for dysuria.  Musculoskeletal: Negative for joint pain and myalgias.  Skin: Negative for rash.  Neurological: Negative for dizziness and headaches.  Endo/Heme/Allergies: Does not bruise/bleed easily.     No Known Allergies Outpatient Medications Prior to Visit  Medication Sig Dispense Refill  . ARIPiprazole (ABILIFY) 5 MG tablet Take 1 tablet (5 mg total) by mouth daily. 90 tablet 0  . clindamycin (CLEOCIN T) 1 % external solution     . Multiple Vitamin (MULTIVITAMIN) tablet Take  1 tablet by mouth daily.    Marland Kitchen venlafaxine XR (EFFEXOR XR) 150 MG 24 hr capsule Take 1 capsule (150 mg total) by mouth daily with breakfast. 90 capsule 0   No facility-administered medications prior to visit.      Patient Active Problem List   Diagnosis Date Noted  . Elevated amylase and  lipase 11/23/2018  . Low bone density for age 47/01/2018  . Other osteoporosis without current pathological fracture 04/22/2018  . Moderate protein-calorie malnutrition (HCC) 03/16/2018  . Underweight 01/20/2018  . Bradycardia 01/20/2018  . Secondary amenorrhea 01/20/2018  . Migraine with aura 01/20/2018  . Adjustment disorder with anxiety 09/28/2014  . Eating disorder 03/08/2014    Past Medical History:  Reviewed and updated?  yes Past Medical History:  Diagnosis Date  . Anorexia nervosa   . Anxiety     Family History: Reviewed and updated? yes Family History  Problem Relation Age of Onset  . Anxiety disorder Mother     Confidentiality was discussed with the patient and if applicable, with caregiver as well.   The following portions of the patient's history were reviewed and updated as appropriate: allergies, current medications, past family history, past medical history, past social history, past surgical history and problem list.  Visual Observations/Objective:   General Appearance: Well nourished well developed, in no apparent distress.  Eyes: conjunctiva no swelling or erythema ENT/Mouth: No hoarseness, No cough for duration of visit.  Neck: Supple  Respiratory: Respiratory effort normal, normal rate, no retractions or distress.   Cardio: Appears well-perfused, noncyanotic Musculoskeletal: no obvious deformity Skin: visible skin without rashes, ecchymosis, erythema Neuro: Awake and oriented X 3,  Psych:  normal affect, Insight and Judgment appropriate.    Assessment/Plan: 1. Anorexia nervosa, restricting type Has continued to do well since her inpatient treatment. She involved her family in her treatment and although it was difficult, she is glad to have more support. Continues weekly with dietitian and therapist. Last weight was stable.   2. Adjustment disorder with anxiety Continue effexor 150 mg and abilify 5 mg.   3. Low bone density for age Will be due  for repeat bone density around 05/2020. Periods have returned, so hopefully bone density will continue to improve.     I discussed the assessment and treatment plan with the patient and/or parent/guardian.  They were provided an opportunity to ask questions and all were answered.  They agreed with the plan and demonstrated an understanding of the instructions. They were advised to call back or seek an in-person evaluation in the emergency room if the symptoms worsen or if the condition fails to improve as anticipated.   Follow-up:   4 weeks in office  Medical decision-making:   I spent 15 minutes on this telehealth visit inclusive of face-to-face video and care coordination time I was located off site during this encounter.   Alfonso Ramus, FNP    CC: [name], FNP, [name], FNP

## 2019-07-09 DIAGNOSIS — F5000 Anorexia nervosa, unspecified: Secondary | ICD-10-CM | POA: Diagnosis not present

## 2019-07-23 DIAGNOSIS — F5000 Anorexia nervosa, unspecified: Secondary | ICD-10-CM | POA: Diagnosis not present

## 2019-07-29 DIAGNOSIS — Z713 Dietary counseling and surveillance: Secondary | ICD-10-CM | POA: Diagnosis not present

## 2019-07-30 DIAGNOSIS — F5000 Anorexia nervosa, unspecified: Secondary | ICD-10-CM | POA: Diagnosis not present

## 2019-08-05 DIAGNOSIS — Z713 Dietary counseling and surveillance: Secondary | ICD-10-CM | POA: Diagnosis not present

## 2019-08-06 DIAGNOSIS — F5000 Anorexia nervosa, unspecified: Secondary | ICD-10-CM | POA: Diagnosis not present

## 2019-08-06 DIAGNOSIS — M9901 Segmental and somatic dysfunction of cervical region: Secondary | ICD-10-CM | POA: Diagnosis not present

## 2019-08-06 DIAGNOSIS — M9902 Segmental and somatic dysfunction of thoracic region: Secondary | ICD-10-CM | POA: Diagnosis not present

## 2019-08-06 DIAGNOSIS — M9905 Segmental and somatic dysfunction of pelvic region: Secondary | ICD-10-CM | POA: Diagnosis not present

## 2019-08-06 DIAGNOSIS — M9903 Segmental and somatic dysfunction of lumbar region: Secondary | ICD-10-CM | POA: Diagnosis not present

## 2019-08-09 ENCOUNTER — Ambulatory Visit: Payer: BC Managed Care – PPO | Admitting: Pediatrics

## 2019-08-12 DIAGNOSIS — Z713 Dietary counseling and surveillance: Secondary | ICD-10-CM | POA: Diagnosis not present

## 2019-08-13 DIAGNOSIS — F5000 Anorexia nervosa, unspecified: Secondary | ICD-10-CM | POA: Diagnosis not present

## 2019-08-16 ENCOUNTER — Ambulatory Visit (INDEPENDENT_AMBULATORY_CARE_PROVIDER_SITE_OTHER): Payer: BC Managed Care – PPO | Admitting: Pediatrics

## 2019-08-16 ENCOUNTER — Other Ambulatory Visit: Payer: Self-pay

## 2019-08-16 ENCOUNTER — Encounter: Payer: Self-pay | Admitting: Pediatrics

## 2019-08-16 ENCOUNTER — Other Ambulatory Visit (HOSPITAL_COMMUNITY)
Admission: RE | Admit: 2019-08-16 | Discharge: 2019-08-16 | Disposition: A | Discharge status: Home / Self Care | Payer: BLUE CROSS/BLUE SHIELD | Source: Ambulatory Visit | Attending: Pediatrics | Admitting: Pediatrics

## 2019-08-16 VITALS — BP 106/72 | HR 86 | Ht 67.0 in | Wt 129.0 lb

## 2019-08-16 DIAGNOSIS — Z23 Encounter for immunization: Secondary | ICD-10-CM

## 2019-08-16 DIAGNOSIS — Z124 Encounter for screening for malignant neoplasm of cervix: Secondary | ICD-10-CM

## 2019-08-16 DIAGNOSIS — F5001 Anorexia nervosa, restricting type: Secondary | ICD-10-CM | POA: Diagnosis not present

## 2019-08-16 DIAGNOSIS — F4322 Adjustment disorder with anxiety: Secondary | ICD-10-CM

## 2019-08-16 DIAGNOSIS — M859 Disorder of bone density and structure, unspecified: Secondary | ICD-10-CM

## 2019-08-16 DIAGNOSIS — M8580 Other specified disorders of bone density and structure, unspecified site: Secondary | ICD-10-CM

## 2019-08-16 MED ORDER — ARIPIPRAZOLE 5 MG PO TABS: 5.0000 mg | ORAL_TABLET | Freq: Every day | ORAL | 0 refills | Status: DC

## 2019-08-16 MED ORDER — VENLAFAXINE HCL ER 150 MG PO CP24: 150.0000 mg | ORAL_CAPSULE | Freq: Every day | ORAL | 0 refills | Status: DC

## 2019-08-16 NOTE — Progress Notes (Signed)
History was provided by the patient.  Carolyn Smith is a 23 y.o. female who is here for follow up eating disorder, anxiety, depression.  Carolyn Skill, FNP   HPI:  Pt reports that she is doing well. She is mostly in the office for work.   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 Seeing each of them once a week. Continuing to work on keeping movement more minimal- walking dog and doing yoga once a week.   Fiance is weighing her every other week and sending to dietitian.   agreeable to pap today. Consents signed for me to reach out to Carolyn Smith to discuss transition of care. Her goals are to have someone for well care and med management.    No LMP recorded.  Review of Systems  Constitutional: Negative for malaise/fatigue.  Eyes: Negative for double vision.  Respiratory: Negative for shortness of breath.   Cardiovascular: Negative for chest pain and palpitations.  Gastrointestinal: Negative for abdominal pain, constipation, diarrhea, nausea and vomiting.  Genitourinary: Negative for dysuria.  Musculoskeletal: Negative for joint pain and myalgias.  Skin: Negative for rash.  Neurological: Negative for dizziness and headaches.  Endo/Heme/Allergies: Does not bruise/bleed easily.  Psychiatric/Behavioral: Negative for depression. The patient is not nervous/anxious and does not have insomnia.     Patient Active Problem List   Diagnosis Date Noted  . Low bone density for age 32/01/2018  . Migraine with aura 01/20/2018  . Adjustment disorder with anxiety 09/28/2014  . Eating disorder 03/08/2014    Current Outpatient Medications on File Prior to Visit  Medication Sig Dispense Refill  . clindamycin (CLEOCIN T) 1 % external solution     . Multiple Vitamin (MULTIVITAMIN) tablet Take 1 tablet by mouth daily.     No current facility-administered medications on file prior to visit.     No Known Allergies  Social History: Confidentiality was  discussed with the patient and if applicable, with caregiver as well. Tobacco: no Secondhand smoke exposure? no Drugs/EtOH: none Sexually active? yes - nexplanon  Safety: safe to self and in relationships  Last STI Screening:today  Pregnancy Prevention: nexplanon  Physical Exam:    Vitals:   08/16/19 0957  BP: 106/72  Pulse: 86  Weight: 129 lb (58.5 kg)  Height: 5\' 7"  (1.702 m)    Growth percentile SmartLinks can only be used for patients less than 75 years old.  Physical Exam Vitals signs and nursing note reviewed. Exam conducted with a chaperone present.  Constitutional:      General: She is not in acute distress.    Appearance: She is well-developed.  Neck:     Thyroid: No thyromegaly.  Cardiovascular:     Rate and Rhythm: Normal rate and regular rhythm.     Heart sounds: No murmur.  Pulmonary:     Breath sounds: Normal breath sounds.  Abdominal:     Palpations: Abdomen is soft. There is no mass.     Tenderness: There is no abdominal tenderness. There is no guarding.  Genitourinary:    General: Normal vulva.     Pubic Area: No rash.      Labia:        Right: No rash.        Left: No rash.      Vagina: Normal.     Cervix: Normal.     Uterus: Normal.      Adnexa: Right adnexa normal and left adnexa normal.  Musculoskeletal:  Right lower leg: No edema.     Left lower leg: No edema.  Lymphadenopathy:     Cervical: No cervical adenopathy.  Skin:    General: Skin is warm.     Findings: No rash.  Neurological:     General: No focal deficit present.     Mental Status: She is alert and oriented to person, place, and time.     Comments: No tremor  Psychiatric:        Mood and Affect: Mood normal.        Behavior: Behavior normal.     Assessment/Plan: 1. Anorexia nervosa, restricting type Stable with treatment team. I will connect with Carolyn Smith to discuss transition.   2. Encounter for Papanicolaou smear for cervical cancer screening Due for pap-  completed today.  - Cytology - PAP  3. Needs flu shot Per protocol.  - Flu vaccine QUAD IM, ages 40 months and up, preservative free  4. Adjustment disorder with anxiety Stable on effexor and abilify. Hopefully can have med monitoring with FNP.  - venlafaxine XR (EFFEXOR XR) 150 MG 24 hr capsule; Take 1 capsule (150 mg total) by mouth daily with breakfast.  Dispense: 90 capsule; Refill: 0 - ARIPiprazole (ABILIFY) 5 MG tablet; Take 1 tablet (5 mg total) by mouth daily.  Dispense: 90 tablet; Refill: 0  5. Low bone density for age Followed by [facility]- due to see them again in 2021.

## 2019-08-18 LAB — CYTOLOGY - PAP
Chlamydia: NEGATIVE
Comment: NEGATIVE
Comment: NORMAL
Diagnosis: NEGATIVE
Neisseria Gonorrhea: NEGATIVE

## 2019-08-19 DIAGNOSIS — Z713 Dietary counseling and surveillance: Secondary | ICD-10-CM | POA: Diagnosis not present

## 2019-08-20 DIAGNOSIS — F5000 Anorexia nervosa, unspecified: Secondary | ICD-10-CM | POA: Diagnosis not present

## 2019-08-24 DIAGNOSIS — F5000 Anorexia nervosa, unspecified: Secondary | ICD-10-CM | POA: Diagnosis not present

## 2019-09-02 DIAGNOSIS — Z713 Dietary counseling and surveillance: Secondary | ICD-10-CM | POA: Diagnosis not present

## 2019-09-03 DIAGNOSIS — F5000 Anorexia nervosa, unspecified: Secondary | ICD-10-CM | POA: Diagnosis not present

## 2019-09-09 DIAGNOSIS — Z713 Dietary counseling and surveillance: Secondary | ICD-10-CM | POA: Diagnosis not present

## 2019-09-10 DIAGNOSIS — M9901 Segmental and somatic dysfunction of cervical region: Secondary | ICD-10-CM | POA: Diagnosis not present

## 2019-09-10 DIAGNOSIS — M9905 Segmental and somatic dysfunction of pelvic region: Secondary | ICD-10-CM | POA: Diagnosis not present

## 2019-09-10 DIAGNOSIS — M9903 Segmental and somatic dysfunction of lumbar region: Secondary | ICD-10-CM | POA: Diagnosis not present

## 2019-09-10 DIAGNOSIS — F5000 Anorexia nervosa, unspecified: Secondary | ICD-10-CM | POA: Diagnosis not present

## 2019-09-10 DIAGNOSIS — M9902 Segmental and somatic dysfunction of thoracic region: Secondary | ICD-10-CM | POA: Diagnosis not present

## 2019-09-16 DIAGNOSIS — Z713 Dietary counseling and surveillance: Secondary | ICD-10-CM | POA: Diagnosis not present

## 2019-09-17 DIAGNOSIS — F5000 Anorexia nervosa, unspecified: Secondary | ICD-10-CM | POA: Diagnosis not present

## 2019-09-28 ENCOUNTER — Other Ambulatory Visit: Payer: Self-pay | Admitting: Pediatrics

## 2019-09-28 DIAGNOSIS — N921 Excessive and frequent menstruation with irregular cycle: Secondary | ICD-10-CM

## 2019-09-28 DIAGNOSIS — Z975 Presence of (intrauterine) contraceptive device: Secondary | ICD-10-CM

## 2019-09-28 MED ORDER — TRANEXAMIC ACID 650 MG PO TABS: 1300.0000 mg | ORAL_TABLET | Freq: Three times a day (TID) | ORAL | 3 refills | Status: AC

## 2019-10-07 DIAGNOSIS — Z713 Dietary counseling and surveillance: Secondary | ICD-10-CM | POA: Diagnosis not present

## 2019-10-08 DIAGNOSIS — M9905 Segmental and somatic dysfunction of pelvic region: Secondary | ICD-10-CM | POA: Diagnosis not present

## 2019-10-08 DIAGNOSIS — M9901 Segmental and somatic dysfunction of cervical region: Secondary | ICD-10-CM | POA: Diagnosis not present

## 2019-10-08 DIAGNOSIS — M9903 Segmental and somatic dysfunction of lumbar region: Secondary | ICD-10-CM | POA: Diagnosis not present

## 2019-10-08 DIAGNOSIS — M9902 Segmental and somatic dysfunction of thoracic region: Secondary | ICD-10-CM | POA: Diagnosis not present

## 2019-10-14 DIAGNOSIS — F5000 Anorexia nervosa, unspecified: Secondary | ICD-10-CM | POA: Diagnosis not present

## 2019-10-15 DIAGNOSIS — F5000 Anorexia nervosa, unspecified: Secondary | ICD-10-CM | POA: Diagnosis not present

## 2019-10-20 DIAGNOSIS — F5001 Anorexia nervosa, restricting type: Secondary | ICD-10-CM | POA: Diagnosis not present

## 2019-10-21 DIAGNOSIS — F5000 Anorexia nervosa, unspecified: Secondary | ICD-10-CM | POA: Diagnosis not present

## 2019-10-29 DIAGNOSIS — F5000 Anorexia nervosa, unspecified: Secondary | ICD-10-CM | POA: Diagnosis not present

## 2019-11-03 DIAGNOSIS — L700 Acne vulgaris: Secondary | ICD-10-CM | POA: Diagnosis not present

## 2019-11-03 DIAGNOSIS — F5001 Anorexia nervosa, restricting type: Secondary | ICD-10-CM | POA: Diagnosis not present

## 2019-11-03 DIAGNOSIS — Z79899 Other long term (current) drug therapy: Secondary | ICD-10-CM | POA: Diagnosis not present

## 2019-11-05 DIAGNOSIS — M9901 Segmental and somatic dysfunction of cervical region: Secondary | ICD-10-CM | POA: Diagnosis not present

## 2019-11-05 DIAGNOSIS — M9903 Segmental and somatic dysfunction of lumbar region: Secondary | ICD-10-CM | POA: Diagnosis not present

## 2019-11-05 DIAGNOSIS — M9905 Segmental and somatic dysfunction of pelvic region: Secondary | ICD-10-CM | POA: Diagnosis not present

## 2019-11-05 DIAGNOSIS — M9902 Segmental and somatic dysfunction of thoracic region: Secondary | ICD-10-CM | POA: Diagnosis not present

## 2019-11-10 DIAGNOSIS — F5001 Anorexia nervosa, restricting type: Secondary | ICD-10-CM | POA: Diagnosis not present

## 2019-11-12 DIAGNOSIS — F5000 Anorexia nervosa, unspecified: Secondary | ICD-10-CM | POA: Diagnosis not present

## 2019-11-12 DIAGNOSIS — Z79899 Other long term (current) drug therapy: Secondary | ICD-10-CM | POA: Diagnosis not present

## 2019-11-12 DIAGNOSIS — L700 Acne vulgaris: Secondary | ICD-10-CM | POA: Diagnosis not present

## 2019-11-21 ENCOUNTER — Other Ambulatory Visit: Payer: Self-pay

## 2019-11-21 DIAGNOSIS — F4322 Adjustment disorder with anxiety: Secondary | ICD-10-CM

## 2019-11-22 MED ORDER — ARIPIPRAZOLE 5 MG PO TABS: 5.0000 mg | ORAL_TABLET | Freq: Every day | ORAL | 0 refills | Status: DC

## 2019-11-24 DIAGNOSIS — Z713 Dietary counseling and surveillance: Secondary | ICD-10-CM | POA: Diagnosis not present

## 2019-11-26 DIAGNOSIS — F5000 Anorexia nervosa, unspecified: Secondary | ICD-10-CM | POA: Diagnosis not present

## 2019-12-01 DIAGNOSIS — Z3202 Encounter for pregnancy test, result negative: Secondary | ICD-10-CM | POA: Diagnosis not present

## 2019-12-01 DIAGNOSIS — Z713 Dietary counseling and surveillance: Secondary | ICD-10-CM | POA: Diagnosis not present

## 2019-12-01 DIAGNOSIS — L700 Acne vulgaris: Secondary | ICD-10-CM | POA: Diagnosis not present

## 2019-12-01 DIAGNOSIS — Z79899 Other long term (current) drug therapy: Secondary | ICD-10-CM | POA: Diagnosis not present

## 2019-12-02 ENCOUNTER — Ambulatory Visit: Payer: Self-pay | Attending: Internal Medicine

## 2019-12-02 DIAGNOSIS — Z23 Encounter for immunization: Secondary | ICD-10-CM | POA: Insufficient documentation

## 2019-12-02 NOTE — Progress Notes (Signed)
   Covid-19 Vaccination Clinic  Name:  Carolyn Smith    MRN: [id number] DOB: [birth year]  12/02/2019  Carolyn Smith was observed post Covid-19 immunization for 15 minutes without incident. She was provided with Vaccine Information Sheet and instruction to access the V-Safe system.   Carolyn Smith was instructed to call 911 with any severe reactions post vaccine: Marland Kitchen Difficulty breathing  . Swelling of face and throat  . A fast heartbeat  . A bad rash all over body  . Dizziness and weakness   Immunizations Administered    Name Date Dose VIS Date Route   Pfizer COVID-19 Vaccine 12/02/2019  6:25 PM 0.3 mL 09/10/2019 Intramuscular   Manufacturer: [employer], [employer]   Lot: [id number]   NDC: [address]-7

## 2019-12-03 DIAGNOSIS — M9901 Segmental and somatic dysfunction of cervical region: Secondary | ICD-10-CM | POA: Diagnosis not present

## 2019-12-03 DIAGNOSIS — M9902 Segmental and somatic dysfunction of thoracic region: Secondary | ICD-10-CM | POA: Diagnosis not present

## 2019-12-03 DIAGNOSIS — F5000 Anorexia nervosa, unspecified: Secondary | ICD-10-CM | POA: Diagnosis not present

## 2019-12-03 DIAGNOSIS — M9905 Segmental and somatic dysfunction of pelvic region: Secondary | ICD-10-CM | POA: Diagnosis not present

## 2019-12-03 DIAGNOSIS — M9903 Segmental and somatic dysfunction of lumbar region: Secondary | ICD-10-CM | POA: Diagnosis not present

## 2019-12-08 DIAGNOSIS — Z713 Dietary counseling and surveillance: Secondary | ICD-10-CM | POA: Diagnosis not present

## 2019-12-09 ENCOUNTER — Other Ambulatory Visit: Payer: Self-pay | Admitting: Pediatrics

## 2019-12-09 ENCOUNTER — Other Ambulatory Visit: Payer: Self-pay

## 2019-12-09 DIAGNOSIS — F4322 Adjustment disorder with anxiety: Secondary | ICD-10-CM

## 2019-12-09 MED ORDER — VENLAFAXINE HCL ER 150 MG PO CP24: 150.0000 mg | ORAL_CAPSULE | Freq: Every day | ORAL | 0 refills | Status: DC

## 2019-12-09 MED ORDER — ARIPIPRAZOLE 5 MG PO TABS: 5.0000 mg | ORAL_TABLET | Freq: Every day | ORAL | 0 refills | Status: AC

## 2019-12-09 NOTE — Telephone Encounter (Signed)
Please call pt and find out if she has plans to transition to adult doc as discussed or has had issues getting appt at all. We should see her again at least for check in if she can't get there soon for med refill.

## 2019-12-09 NOTE — Telephone Encounter (Signed)
Sent MyChart message to patient

## 2019-12-10 DIAGNOSIS — F5000 Anorexia nervosa, unspecified: Secondary | ICD-10-CM | POA: Diagnosis not present

## 2019-12-15 DIAGNOSIS — Z713 Dietary counseling and surveillance: Secondary | ICD-10-CM | POA: Diagnosis not present

## 2019-12-17 DIAGNOSIS — F5000 Anorexia nervosa, unspecified: Secondary | ICD-10-CM | POA: Diagnosis not present

## 2019-12-22 DIAGNOSIS — Z713 Dietary counseling and surveillance: Secondary | ICD-10-CM | POA: Diagnosis not present

## 2019-12-24 DIAGNOSIS — F5000 Anorexia nervosa, unspecified: Secondary | ICD-10-CM | POA: Diagnosis not present

## 2019-12-29 ENCOUNTER — Ambulatory Visit: Payer: Self-pay | Attending: Internal Medicine

## 2019-12-29 DIAGNOSIS — Z23 Encounter for immunization: Secondary | ICD-10-CM

## 2019-12-29 NOTE — Progress Notes (Signed)
   Covid-19 Vaccination Clinic  Name:  Carolyn Smith    MRN: [id number] DOB: [birth year]  12/29/2019  Ms. College was observed post Covid-19 immunization for 15 minutes without incident. She was provided with Vaccine Information Sheet and instruction to access the V-Safe system.   [name] was instructed to call 911 with any severe reactions post vaccine: Marland Kitchen Difficulty breathing  . Swelling of face and throat  . A fast heartbeat  . A bad rash all over body  . Dizziness and weakness   Immunizations Administered    Name Date Dose VIS Date Route   Pfizer COVID-19 Vaccine 12/29/2019  4:10 PM 0.3 mL 09/10/2019 Intramuscular   Manufacturer: [employer], [employer]   Lot: [id number]   NDC: [address]-6

## 2019-12-30 DIAGNOSIS — Z79899 Other long term (current) drug therapy: Secondary | ICD-10-CM | POA: Diagnosis not present

## 2019-12-30 DIAGNOSIS — L700 Acne vulgaris: Secondary | ICD-10-CM | POA: Diagnosis not present

## 2020-01-05 DIAGNOSIS — L700 Acne vulgaris: Secondary | ICD-10-CM | POA: Diagnosis not present

## 2020-01-05 DIAGNOSIS — Z3202 Encounter for pregnancy test, result negative: Secondary | ICD-10-CM | POA: Diagnosis not present

## 2020-01-05 DIAGNOSIS — Z713 Dietary counseling and surveillance: Secondary | ICD-10-CM | POA: Diagnosis not present

## 2020-01-07 DIAGNOSIS — M9901 Segmental and somatic dysfunction of cervical region: Secondary | ICD-10-CM | POA: Diagnosis not present

## 2020-01-07 DIAGNOSIS — M9905 Segmental and somatic dysfunction of pelvic region: Secondary | ICD-10-CM | POA: Diagnosis not present

## 2020-01-07 DIAGNOSIS — M9903 Segmental and somatic dysfunction of lumbar region: Secondary | ICD-10-CM | POA: Diagnosis not present

## 2020-01-07 DIAGNOSIS — M9902 Segmental and somatic dysfunction of thoracic region: Secondary | ICD-10-CM | POA: Diagnosis not present

## 2020-01-07 DIAGNOSIS — F5000 Anorexia nervosa, unspecified: Secondary | ICD-10-CM | POA: Diagnosis not present

## 2020-01-12 DIAGNOSIS — F5001 Anorexia nervosa, restricting type: Secondary | ICD-10-CM | POA: Diagnosis not present

## 2020-01-14 DIAGNOSIS — F5000 Anorexia nervosa, unspecified: Secondary | ICD-10-CM | POA: Diagnosis not present

## 2020-01-19 DIAGNOSIS — Z713 Dietary counseling and surveillance: Secondary | ICD-10-CM | POA: Diagnosis not present

## 2020-01-21 DIAGNOSIS — F5000 Anorexia nervosa, unspecified: Secondary | ICD-10-CM | POA: Diagnosis not present

## 2020-01-26 DIAGNOSIS — Z713 Dietary counseling and surveillance: Secondary | ICD-10-CM | POA: Diagnosis not present

## 2020-01-27 ENCOUNTER — Ambulatory Visit (INDEPENDENT_AMBULATORY_CARE_PROVIDER_SITE_OTHER): Payer: BC Managed Care – PPO | Admitting: Internal Medicine

## 2020-01-27 ENCOUNTER — Other Ambulatory Visit: Payer: Self-pay

## 2020-01-27 ENCOUNTER — Encounter: Payer: Self-pay | Admitting: Internal Medicine

## 2020-01-27 VITALS — BP 106/70 | HR 79 | Temp 98.2°F | Ht 67.0 in | Wt 128.0 lb

## 2020-01-27 DIAGNOSIS — F4322 Adjustment disorder with anxiety: Secondary | ICD-10-CM | POA: Diagnosis not present

## 2020-01-27 DIAGNOSIS — N926 Irregular menstruation, unspecified: Secondary | ICD-10-CM

## 2020-01-27 DIAGNOSIS — F5001 Anorexia nervosa, restricting type: Secondary | ICD-10-CM | POA: Diagnosis not present

## 2020-01-27 DIAGNOSIS — G43119 Migraine with aura, intractable, without status migrainosus: Secondary | ICD-10-CM | POA: Diagnosis not present

## 2020-01-27 NOTE — Progress Notes (Signed)
HPI  Pt presents to the clinic today to establish care and for management of the conditions listed below. She is transferring care from [facility].  Anorexia: She has suffered from this for 5 years.She is seeing a therapist and dietician weekly.  Anxiety: Chronic, managed on Venlafaxine, and Abilify. She is seeing a therapist weekly.  Irregular Menses: Since getting the Nexplanon. She has had to take Lysteda in the past for the same.  Migraine with Aura: These occur about 1 x year. She is not sure what triggers this.  Flu: 08/2019 Tetanus:  unsure Covid: 11/2019, 11/2019 Pap Smear: 08/2019 Dentist: biannually  Past Medical History:  Diagnosis Date  . Anorexia nervosa   . Anxiety     Current Outpatient Medications  Medication Sig Dispense Refill  . ARIPiprazole (ABILIFY) 5 MG tablet Take 1 tablet (5 mg total) by mouth daily. 90 tablet 0  . CLARAVIS 30 MG capsule     . clindamycin (CLEOCIN T) 1 % external solution     . Multiple Vitamin (MULTIVITAMIN) tablet Take 1 tablet by mouth daily.    . tranexamic acid (LYSTEDA) 650 MG TABS tablet Take 2 tablets (1,300 mg total) by mouth 3 (three) times daily. 30 tablet 3  . venlafaxine XR (EFFEXOR XR) 150 MG 24 hr capsule Take 1 capsule (150 mg total) by mouth daily with breakfast. 90 capsule 0   No current facility-administered medications for this visit.    No Known Allergies  Family History  Problem Relation Age of Onset  . Anxiety disorder Mother     Social History   Socioeconomic History  . Marital status: Significant Other    Spouse name: Not on file  . Number of children: Not on file  . Years of education: Not on file  . Highest education level: Not on file  Occupational History  . Not on file  Tobacco Use  . Smoking status: Never Smoker  . Smokeless tobacco: Never Used  Substance and Sexual Activity  . Alcohol use: Not on file  . Drug use: Not on file  . Sexual activity: Not on file  Other Topics Concern  .  Not on file  Social History Narrative  . Not on file   Social Determinants of Health   Financial Resource Strain:   . Difficulty of Paying Living Expenses:   Food Insecurity:   . Worried About Programme researcher, broadcasting/film/video in the Last Year:   . Barista in the Last Year:   Transportation Needs:   . Freight forwarder (Medical):   Marland Kitchen Lack of Transportation (Non-Medical):   Physical Activity:   . Days of Exercise per Week:   . Minutes of Exercise per Session:   Stress:   . Feeling of Stress :   Social Connections:   . Frequency of Communication with Friends and Family:   . Frequency of Social Gatherings with Friends and Family:   . Attends Religious Services:   . Active Member of Clubs or Organizations:   . Attends Banker Meetings:   Marland Kitchen Marital Status:   Intimate Partner Violence:   . Fear of Current or Ex-Partner:   . Emotionally Abused:   Marland Kitchen Physically Abused:   . Sexually Abused:     ROS:  Constitutional: Pt reports intermittent headaches. Denies fever, malaise, fatigue, or abrupt weight changes.  HEENT: Denies eye pain, eye redness, ear pain, ringing in the ears, wax buildup, runny nose, nasal congestion, bloody nose, or sore  throat. Respiratory: Denies difficulty breathing, shortness of breath, cough or sputum production.   Cardiovascular: Denies chest pain, chest tightness, palpitations or swelling in the hands or feet.  Gastrointestinal: Denies abdominal pain, bloating, constipation, diarrhea or blood in the stool.  GU: Pt reports irregular periods. Denies frequency, urgency, pain with urination, blood in urine, odor or discharge. Musculoskeletal: Denies decrease in range of motion, difficulty with gait, muscle pain or joint pain and swelling.  Skin: Denies redness, rashes, lesions or ulcercations.  Neurological: Denies dizziness, difficulty with memory, difficulty with speech or problems with balance and coordination.  Psych: Pt reports anxiety. Denies  depression, SI/HI.  No other specific complaints in a complete review of systems (except as listed in HPI above).  PE:  BP 106/70   Pulse 79   Temp 98.2 F (36.8 C) (Temporal)   Ht 5\' 7"  (1.702 m)   Wt 128 lb (58.1 kg)   LMP 12/20/2019 Comment: Nexplanon  SpO2 98%   BMI 20.05 kg/m   Wt Readings from Last 3 Encounters:  08/16/19 129 lb (58.5 kg)  06/10/19 128 lb 12.8 oz (58.4 kg)  12/03/18 107 lb 9.6 oz (48.8 kg)    General: Appears her stated age, well developed, well nourished in NAD. Skin: Dry and intact. Cardiovascular: Normal rate. Pulmonary/Chest: Normal effort.  Musculoskeletal: No difficulty with gait.  Neurological: Alert and oriented.  Psychiatric: Mood and affect normal. Behavior is normal. Judgment and thought content normal.     BMET    Component Value Date/Time   NA 140 12/03/2018 1112   K 5.4 (H) 12/03/2018 1112   CL 103 12/03/2018 1112   CO2 32 12/03/2018 1112   GLUCOSE 100 (H) 12/03/2018 1112   BUN 16 12/03/2018 1112   CREATININE 0.70 12/03/2018 1112   CALCIUM 10.5 (H) 12/03/2018 1112    Lipid Panel     Component Value Date/Time   CHOL 163 11/09/2018 1209   TRIG 55 11/09/2018 1209   HDL 81 11/09/2018 1209   CHOLHDL 2.0 11/09/2018 1209   LDLCALC 68 11/09/2018 1209    CBC    Component Value Date/Time   WBC 4.5 12/03/2018 1112   RBC 4.65 12/03/2018 1112   HGB 14.8 12/03/2018 1112   HCT 43.3 12/03/2018 1112   PLT 274 12/03/2018 1112   MCV 93.1 12/03/2018 1112   MCH 31.8 12/03/2018 1112   MCHC 34.2 12/03/2018 1112   RDW 11.6 12/03/2018 1112   LYMPHSABS 1,013 12/03/2018 1112   EOSABS 0 (L) 12/03/2018 1112   BASOSABS 41 12/03/2018 1112    Hgb A1C Lab Results  Component Value Date   HGBA1C 5.2 11/09/2018     Assessment and Plan:  Irregular Menses  Consider taking out Nexplanon and getting IUD She will think about this  RTC in 1 year, sooner if needed Webb Silversmith, NP This visit occurred during the SARS-CoV-2 public  health emergency.  Safety protocols were in place, including screening questions prior to the visit, additional usage of staff PPE, and extensive cleaning of exam room while observing appropriate contact time as indicated for disinfecting solutions.

## 2020-01-27 NOTE — Patient Instructions (Signed)

## 2020-01-27 NOTE — Assessment & Plan Note (Signed)
Will try to wean off Abilify- weaning instructions provided Continue Venlafaxine for now She will continue to meet with her therapist weekly

## 2020-01-27 NOTE — Assessment & Plan Note (Signed)
She will continue to meet with her therapist and dietician weekly

## 2020-01-27 NOTE — Assessment & Plan Note (Signed)
Will monitor

## 2020-01-28 DIAGNOSIS — F5000 Anorexia nervosa, unspecified: Secondary | ICD-10-CM | POA: Diagnosis not present

## 2020-02-04 DIAGNOSIS — M9905 Segmental and somatic dysfunction of pelvic region: Secondary | ICD-10-CM | POA: Diagnosis not present

## 2020-02-04 DIAGNOSIS — M9901 Segmental and somatic dysfunction of cervical region: Secondary | ICD-10-CM | POA: Diagnosis not present

## 2020-02-04 DIAGNOSIS — M9902 Segmental and somatic dysfunction of thoracic region: Secondary | ICD-10-CM | POA: Diagnosis not present

## 2020-02-04 DIAGNOSIS — F5000 Anorexia nervosa, unspecified: Secondary | ICD-10-CM | POA: Diagnosis not present

## 2020-02-04 DIAGNOSIS — M9903 Segmental and somatic dysfunction of lumbar region: Secondary | ICD-10-CM | POA: Diagnosis not present

## 2020-02-07 DIAGNOSIS — L700 Acne vulgaris: Secondary | ICD-10-CM | POA: Diagnosis not present

## 2020-02-07 DIAGNOSIS — Z79899 Other long term (current) drug therapy: Secondary | ICD-10-CM | POA: Diagnosis not present

## 2020-02-07 DIAGNOSIS — Z3202 Encounter for pregnancy test, result negative: Secondary | ICD-10-CM | POA: Diagnosis not present

## 2020-02-09 DIAGNOSIS — Z713 Dietary counseling and surveillance: Secondary | ICD-10-CM | POA: Diagnosis not present

## 2020-02-11 DIAGNOSIS — F5000 Anorexia nervosa, unspecified: Secondary | ICD-10-CM | POA: Diagnosis not present

## 2020-02-16 DIAGNOSIS — F5000 Anorexia nervosa, unspecified: Secondary | ICD-10-CM | POA: Diagnosis not present

## 2020-02-18 DIAGNOSIS — F5000 Anorexia nervosa, unspecified: Secondary | ICD-10-CM | POA: Diagnosis not present

## 2020-02-24 DIAGNOSIS — Z713 Dietary counseling and surveillance: Secondary | ICD-10-CM | POA: Diagnosis not present

## 2020-02-27 ENCOUNTER — Other Ambulatory Visit: Payer: Self-pay

## 2020-02-27 DIAGNOSIS — F4322 Adjustment disorder with anxiety: Secondary | ICD-10-CM

## 2020-02-29 MED ORDER — VENLAFAXINE HCL ER 150 MG PO CP24: 150.0000 mg | ORAL_CAPSULE | Freq: Every day | ORAL | 0 refills | Status: DC

## 2020-03-02 DIAGNOSIS — Z79899 Other long term (current) drug therapy: Secondary | ICD-10-CM | POA: Diagnosis not present

## 2020-03-02 DIAGNOSIS — L700 Acne vulgaris: Secondary | ICD-10-CM | POA: Diagnosis not present

## 2020-03-03 DIAGNOSIS — M9903 Segmental and somatic dysfunction of lumbar region: Secondary | ICD-10-CM | POA: Diagnosis not present

## 2020-03-03 DIAGNOSIS — M9905 Segmental and somatic dysfunction of pelvic region: Secondary | ICD-10-CM | POA: Diagnosis not present

## 2020-03-03 DIAGNOSIS — M9901 Segmental and somatic dysfunction of cervical region: Secondary | ICD-10-CM | POA: Diagnosis not present

## 2020-03-03 DIAGNOSIS — M9902 Segmental and somatic dysfunction of thoracic region: Secondary | ICD-10-CM | POA: Diagnosis not present

## 2020-03-13 DIAGNOSIS — Z3202 Encounter for pregnancy test, result negative: Secondary | ICD-10-CM | POA: Diagnosis not present

## 2020-03-13 DIAGNOSIS — L700 Acne vulgaris: Secondary | ICD-10-CM | POA: Diagnosis not present

## 2020-03-13 DIAGNOSIS — Z79899 Other long term (current) drug therapy: Secondary | ICD-10-CM | POA: Diagnosis not present

## 2020-03-15 DIAGNOSIS — Z713 Dietary counseling and surveillance: Secondary | ICD-10-CM | POA: Diagnosis not present

## 2020-03-17 DIAGNOSIS — F5000 Anorexia nervosa, unspecified: Secondary | ICD-10-CM | POA: Diagnosis not present

## 2020-03-22 DIAGNOSIS — Z713 Dietary counseling and surveillance: Secondary | ICD-10-CM | POA: Diagnosis not present

## 2020-03-24 DIAGNOSIS — F5000 Anorexia nervosa, unspecified: Secondary | ICD-10-CM | POA: Diagnosis not present

## 2020-03-29 DIAGNOSIS — M9905 Segmental and somatic dysfunction of pelvic region: Secondary | ICD-10-CM | POA: Diagnosis not present

## 2020-03-29 DIAGNOSIS — Z713 Dietary counseling and surveillance: Secondary | ICD-10-CM | POA: Diagnosis not present

## 2020-03-29 DIAGNOSIS — M9902 Segmental and somatic dysfunction of thoracic region: Secondary | ICD-10-CM | POA: Diagnosis not present

## 2020-03-29 DIAGNOSIS — M9903 Segmental and somatic dysfunction of lumbar region: Secondary | ICD-10-CM | POA: Diagnosis not present

## 2020-03-29 DIAGNOSIS — M9901 Segmental and somatic dysfunction of cervical region: Secondary | ICD-10-CM | POA: Diagnosis not present

## 2020-04-05 DIAGNOSIS — Z713 Dietary counseling and surveillance: Secondary | ICD-10-CM | POA: Diagnosis not present

## 2020-04-07 DIAGNOSIS — F5000 Anorexia nervosa, unspecified: Secondary | ICD-10-CM | POA: Diagnosis not present

## 2020-04-12 DIAGNOSIS — Z713 Dietary counseling and surveillance: Secondary | ICD-10-CM | POA: Diagnosis not present

## 2020-04-17 DIAGNOSIS — Z79899 Other long term (current) drug therapy: Secondary | ICD-10-CM | POA: Diagnosis not present

## 2020-04-17 DIAGNOSIS — L700 Acne vulgaris: Secondary | ICD-10-CM | POA: Diagnosis not present

## 2020-04-17 DIAGNOSIS — Z3202 Encounter for pregnancy test, result negative: Secondary | ICD-10-CM | POA: Diagnosis not present

## 2020-04-19 DIAGNOSIS — Z713 Dietary counseling and surveillance: Secondary | ICD-10-CM | POA: Diagnosis not present

## 2020-04-20 DIAGNOSIS — F5000 Anorexia nervosa, unspecified: Secondary | ICD-10-CM | POA: Diagnosis not present

## 2020-04-26 DIAGNOSIS — Z713 Dietary counseling and surveillance: Secondary | ICD-10-CM | POA: Diagnosis not present

## 2020-04-27 DIAGNOSIS — M9901 Segmental and somatic dysfunction of cervical region: Secondary | ICD-10-CM | POA: Diagnosis not present

## 2020-04-27 DIAGNOSIS — M9903 Segmental and somatic dysfunction of lumbar region: Secondary | ICD-10-CM | POA: Diagnosis not present

## 2020-04-27 DIAGNOSIS — M9902 Segmental and somatic dysfunction of thoracic region: Secondary | ICD-10-CM | POA: Diagnosis not present

## 2020-04-27 DIAGNOSIS — M9905 Segmental and somatic dysfunction of pelvic region: Secondary | ICD-10-CM | POA: Diagnosis not present

## 2020-04-28 DIAGNOSIS — F5000 Anorexia nervosa, unspecified: Secondary | ICD-10-CM | POA: Diagnosis not present

## 2020-05-03 DIAGNOSIS — Z713 Dietary counseling and surveillance: Secondary | ICD-10-CM | POA: Diagnosis not present

## 2020-05-10 DIAGNOSIS — Z713 Dietary counseling and surveillance: Secondary | ICD-10-CM | POA: Diagnosis not present

## 2020-05-17 DIAGNOSIS — L700 Acne vulgaris: Secondary | ICD-10-CM | POA: Diagnosis not present

## 2020-05-17 DIAGNOSIS — Z3202 Encounter for pregnancy test, result negative: Secondary | ICD-10-CM | POA: Diagnosis not present

## 2020-05-17 DIAGNOSIS — Z79899 Other long term (current) drug therapy: Secondary | ICD-10-CM | POA: Diagnosis not present

## 2020-05-19 DIAGNOSIS — F5000 Anorexia nervosa, unspecified: Secondary | ICD-10-CM | POA: Diagnosis not present

## 2020-05-25 DIAGNOSIS — Z713 Dietary counseling and surveillance: Secondary | ICD-10-CM | POA: Diagnosis not present

## 2020-05-26 DIAGNOSIS — F5000 Anorexia nervosa, unspecified: Secondary | ICD-10-CM | POA: Diagnosis not present

## 2020-05-30 ENCOUNTER — Other Ambulatory Visit: Payer: Self-pay | Admitting: Pediatrics

## 2020-05-30 DIAGNOSIS — F4322 Adjustment disorder with anxiety: Secondary | ICD-10-CM

## 2020-06-01 DIAGNOSIS — M9902 Segmental and somatic dysfunction of thoracic region: Secondary | ICD-10-CM | POA: Diagnosis not present

## 2020-06-01 DIAGNOSIS — M9901 Segmental and somatic dysfunction of cervical region: Secondary | ICD-10-CM | POA: Diagnosis not present

## 2020-06-01 DIAGNOSIS — Z713 Dietary counseling and surveillance: Secondary | ICD-10-CM | POA: Diagnosis not present

## 2020-06-01 DIAGNOSIS — M9903 Segmental and somatic dysfunction of lumbar region: Secondary | ICD-10-CM | POA: Diagnosis not present

## 2020-06-01 DIAGNOSIS — M9905 Segmental and somatic dysfunction of pelvic region: Secondary | ICD-10-CM | POA: Diagnosis not present

## 2020-06-02 DIAGNOSIS — F5000 Anorexia nervosa, unspecified: Secondary | ICD-10-CM | POA: Diagnosis not present

## 2020-06-09 DIAGNOSIS — F5000 Anorexia nervosa, unspecified: Secondary | ICD-10-CM | POA: Diagnosis not present

## 2020-06-12 ENCOUNTER — Other Ambulatory Visit: Payer: Self-pay

## 2020-06-12 DIAGNOSIS — F4322 Adjustment disorder with anxiety: Secondary | ICD-10-CM

## 2020-06-12 MED ORDER — VENLAFAXINE HCL ER 150 MG PO CP24: ORAL_CAPSULE | ORAL | 2 refills | Status: AC

## 2020-06-15 DIAGNOSIS — Z713 Dietary counseling and surveillance: Secondary | ICD-10-CM | POA: Diagnosis not present

## 2020-06-16 DIAGNOSIS — F5000 Anorexia nervosa, unspecified: Secondary | ICD-10-CM | POA: Diagnosis not present

## 2020-06-19 DIAGNOSIS — Z3202 Encounter for pregnancy test, result negative: Secondary | ICD-10-CM | POA: Diagnosis not present

## 2020-06-19 DIAGNOSIS — Z79899 Other long term (current) drug therapy: Secondary | ICD-10-CM | POA: Diagnosis not present

## 2020-06-19 DIAGNOSIS — L700 Acne vulgaris: Secondary | ICD-10-CM | POA: Diagnosis not present

## 2020-06-21 DIAGNOSIS — F5000 Anorexia nervosa, unspecified: Secondary | ICD-10-CM | POA: Diagnosis not present

## 2020-06-29 DIAGNOSIS — M9901 Segmental and somatic dysfunction of cervical region: Secondary | ICD-10-CM | POA: Diagnosis not present

## 2020-06-29 DIAGNOSIS — M9903 Segmental and somatic dysfunction of lumbar region: Secondary | ICD-10-CM | POA: Diagnosis not present

## 2020-06-29 DIAGNOSIS — M9902 Segmental and somatic dysfunction of thoracic region: Secondary | ICD-10-CM | POA: Diagnosis not present

## 2020-06-29 DIAGNOSIS — M9905 Segmental and somatic dysfunction of pelvic region: Secondary | ICD-10-CM | POA: Diagnosis not present

## 2020-07-05 DIAGNOSIS — Z713 Dietary counseling and surveillance: Secondary | ICD-10-CM | POA: Diagnosis not present

## 2020-07-07 DIAGNOSIS — F5000 Anorexia nervosa, unspecified: Secondary | ICD-10-CM | POA: Diagnosis not present

## 2020-07-12 DIAGNOSIS — F5000 Anorexia nervosa, unspecified: Secondary | ICD-10-CM | POA: Diagnosis not present

## 2020-07-14 DIAGNOSIS — F5000 Anorexia nervosa, unspecified: Secondary | ICD-10-CM | POA: Diagnosis not present

## 2020-07-19 DIAGNOSIS — Z3202 Encounter for pregnancy test, result negative: Secondary | ICD-10-CM | POA: Diagnosis not present

## 2020-07-19 DIAGNOSIS — Z79899 Other long term (current) drug therapy: Secondary | ICD-10-CM | POA: Diagnosis not present

## 2020-07-19 DIAGNOSIS — Z713 Dietary counseling and surveillance: Secondary | ICD-10-CM | POA: Diagnosis not present

## 2020-07-19 DIAGNOSIS — L700 Acne vulgaris: Secondary | ICD-10-CM | POA: Diagnosis not present

## 2020-07-21 DIAGNOSIS — F5000 Anorexia nervosa, unspecified: Secondary | ICD-10-CM | POA: Diagnosis not present

## 2020-07-21 DIAGNOSIS — Z23 Encounter for immunization: Secondary | ICD-10-CM | POA: Diagnosis not present

## 2020-07-26 DIAGNOSIS — Z713 Dietary counseling and surveillance: Secondary | ICD-10-CM | POA: Diagnosis not present

## 2020-07-27 DIAGNOSIS — M9905 Segmental and somatic dysfunction of pelvic region: Secondary | ICD-10-CM | POA: Diagnosis not present

## 2020-07-27 DIAGNOSIS — M9903 Segmental and somatic dysfunction of lumbar region: Secondary | ICD-10-CM | POA: Diagnosis not present

## 2020-07-27 DIAGNOSIS — M9902 Segmental and somatic dysfunction of thoracic region: Secondary | ICD-10-CM | POA: Diagnosis not present

## 2020-07-27 DIAGNOSIS — M9901 Segmental and somatic dysfunction of cervical region: Secondary | ICD-10-CM | POA: Diagnosis not present

## 2020-08-04 DIAGNOSIS — F5000 Anorexia nervosa, unspecified: Secondary | ICD-10-CM | POA: Diagnosis not present

## 2020-08-09 ENCOUNTER — Encounter: Payer: Self-pay | Admitting: Internal Medicine

## 2020-08-09 DIAGNOSIS — F5000 Anorexia nervosa, unspecified: Secondary | ICD-10-CM | POA: Diagnosis not present

## 2020-08-15 DIAGNOSIS — F5001 Anorexia nervosa, restricting type: Secondary | ICD-10-CM | POA: Diagnosis not present

## 2020-08-18 DIAGNOSIS — F5000 Anorexia nervosa, unspecified: Secondary | ICD-10-CM | POA: Diagnosis not present

## 2020-08-22 DIAGNOSIS — Z713 Dietary counseling and surveillance: Secondary | ICD-10-CM | POA: Diagnosis not present

## 2020-08-23 DIAGNOSIS — M9902 Segmental and somatic dysfunction of thoracic region: Secondary | ICD-10-CM | POA: Diagnosis not present

## 2020-08-23 DIAGNOSIS — M9903 Segmental and somatic dysfunction of lumbar region: Secondary | ICD-10-CM | POA: Diagnosis not present

## 2020-08-23 DIAGNOSIS — M9905 Segmental and somatic dysfunction of pelvic region: Secondary | ICD-10-CM | POA: Diagnosis not present

## 2020-08-23 DIAGNOSIS — M9901 Segmental and somatic dysfunction of cervical region: Secondary | ICD-10-CM | POA: Diagnosis not present

## 2020-08-28 DIAGNOSIS — L700 Acne vulgaris: Secondary | ICD-10-CM | POA: Diagnosis not present

## 2020-08-28 DIAGNOSIS — Z79899 Other long term (current) drug therapy: Secondary | ICD-10-CM | POA: Diagnosis not present

## 2020-08-29 DIAGNOSIS — Z713 Dietary counseling and surveillance: Secondary | ICD-10-CM | POA: Diagnosis not present

## 2020-08-29 DIAGNOSIS — F5001 Anorexia nervosa, restricting type: Secondary | ICD-10-CM | POA: Diagnosis not present

## 2020-08-30 DIAGNOSIS — F509 Eating disorder, unspecified: Secondary | ICD-10-CM | POA: Diagnosis not present

## 2020-08-30 DIAGNOSIS — F4322 Adjustment disorder with anxiety: Secondary | ICD-10-CM | POA: Diagnosis not present

## 2020-08-30 DIAGNOSIS — Z23 Encounter for immunization: Secondary | ICD-10-CM | POA: Diagnosis not present

## 2020-08-30 DIAGNOSIS — F5001 Anorexia nervosa, restricting type: Secondary | ICD-10-CM | POA: Diagnosis not present

## 2020-09-01 DIAGNOSIS — F5000 Anorexia nervosa, unspecified: Secondary | ICD-10-CM | POA: Diagnosis not present

## 2020-09-05 DIAGNOSIS — Z713 Dietary counseling and surveillance: Secondary | ICD-10-CM | POA: Diagnosis not present

## 2020-09-06 DIAGNOSIS — F5001 Anorexia nervosa, restricting type: Secondary | ICD-10-CM | POA: Diagnosis not present

## 2020-09-06 DIAGNOSIS — F4322 Adjustment disorder with anxiety: Secondary | ICD-10-CM | POA: Diagnosis not present

## 2020-09-08 DIAGNOSIS — F5000 Anorexia nervosa, unspecified: Secondary | ICD-10-CM | POA: Diagnosis not present

## 2020-09-12 DIAGNOSIS — Z713 Dietary counseling and surveillance: Secondary | ICD-10-CM | POA: Diagnosis not present

## 2020-09-13 DIAGNOSIS — F5001 Anorexia nervosa, restricting type: Secondary | ICD-10-CM | POA: Diagnosis not present

## 2020-09-13 DIAGNOSIS — M8580 Other specified disorders of bone density and structure, unspecified site: Secondary | ICD-10-CM | POA: Diagnosis not present

## 2020-09-13 DIAGNOSIS — F4322 Adjustment disorder with anxiety: Secondary | ICD-10-CM | POA: Diagnosis not present

## 2020-09-15 DIAGNOSIS — F5000 Anorexia nervosa, unspecified: Secondary | ICD-10-CM | POA: Diagnosis not present

## 2020-09-25 DIAGNOSIS — F5001 Anorexia nervosa, restricting type: Secondary | ICD-10-CM | POA: Diagnosis not present

## 2020-09-25 DIAGNOSIS — F4322 Adjustment disorder with anxiety: Secondary | ICD-10-CM | POA: Diagnosis not present

## 2020-09-25 DIAGNOSIS — M8580 Other specified disorders of bone density and structure, unspecified site: Secondary | ICD-10-CM | POA: Diagnosis not present

## 2020-09-26 DIAGNOSIS — Z713 Dietary counseling and surveillance: Secondary | ICD-10-CM | POA: Diagnosis not present

## 2020-09-29 DIAGNOSIS — F5000 Anorexia nervosa, unspecified: Secondary | ICD-10-CM | POA: Diagnosis not present

## 2020-10-02 DIAGNOSIS — M9903 Segmental and somatic dysfunction of lumbar region: Secondary | ICD-10-CM | POA: Diagnosis not present

## 2020-10-02 DIAGNOSIS — M9901 Segmental and somatic dysfunction of cervical region: Secondary | ICD-10-CM | POA: Diagnosis not present

## 2020-10-02 DIAGNOSIS — M9905 Segmental and somatic dysfunction of pelvic region: Secondary | ICD-10-CM | POA: Diagnosis not present

## 2020-10-02 DIAGNOSIS — M9902 Segmental and somatic dysfunction of thoracic region: Secondary | ICD-10-CM | POA: Diagnosis not present

## 2020-10-03 DIAGNOSIS — M810 Age-related osteoporosis without current pathological fracture: Secondary | ICD-10-CM | POA: Diagnosis not present

## 2020-10-03 DIAGNOSIS — M8589 Other specified disorders of bone density and structure, multiple sites: Secondary | ICD-10-CM | POA: Diagnosis not present

## 2020-10-03 DIAGNOSIS — Z713 Dietary counseling and surveillance: Secondary | ICD-10-CM | POA: Diagnosis not present

## 2020-10-04 DIAGNOSIS — R4689 Other symptoms and signs involving appearance and behavior: Secondary | ICD-10-CM | POA: Diagnosis not present

## 2020-10-04 DIAGNOSIS — M8580 Other specified disorders of bone density and structure, unspecified site: Secondary | ICD-10-CM | POA: Diagnosis not present

## 2020-10-04 DIAGNOSIS — F5001 Anorexia nervosa, restricting type: Secondary | ICD-10-CM | POA: Diagnosis not present

## 2020-10-04 DIAGNOSIS — F4322 Adjustment disorder with anxiety: Secondary | ICD-10-CM | POA: Diagnosis not present

## 2020-10-06 DIAGNOSIS — F5000 Anorexia nervosa, unspecified: Secondary | ICD-10-CM | POA: Diagnosis not present

## 2020-10-10 DIAGNOSIS — Z713 Dietary counseling and surveillance: Secondary | ICD-10-CM | POA: Diagnosis not present

## 2020-10-11 DIAGNOSIS — Z23 Encounter for immunization: Secondary | ICD-10-CM | POA: Diagnosis not present

## 2020-10-11 DIAGNOSIS — F5001 Anorexia nervosa, restricting type: Secondary | ICD-10-CM | POA: Diagnosis not present

## 2020-10-17 DIAGNOSIS — F5000 Anorexia nervosa, unspecified: Secondary | ICD-10-CM | POA: Diagnosis not present

## 2020-10-18 DIAGNOSIS — R4689 Other symptoms and signs involving appearance and behavior: Secondary | ICD-10-CM | POA: Diagnosis not present

## 2020-10-18 DIAGNOSIS — F5001 Anorexia nervosa, restricting type: Secondary | ICD-10-CM | POA: Diagnosis not present

## 2020-10-25 DIAGNOSIS — F5001 Anorexia nervosa, restricting type: Secondary | ICD-10-CM | POA: Diagnosis not present

## 2020-10-25 DIAGNOSIS — R4689 Other symptoms and signs involving appearance and behavior: Secondary | ICD-10-CM | POA: Diagnosis not present

## 2020-10-25 DIAGNOSIS — F4322 Adjustment disorder with anxiety: Secondary | ICD-10-CM | POA: Diagnosis not present

## 2020-10-25 DIAGNOSIS — M8580 Other specified disorders of bone density and structure, unspecified site: Secondary | ICD-10-CM | POA: Diagnosis not present

## 2020-10-27 DIAGNOSIS — Z713 Dietary counseling and surveillance: Secondary | ICD-10-CM | POA: Diagnosis not present

## 2020-10-30 DIAGNOSIS — M9905 Segmental and somatic dysfunction of pelvic region: Secondary | ICD-10-CM | POA: Diagnosis not present

## 2020-10-30 DIAGNOSIS — M9902 Segmental and somatic dysfunction of thoracic region: Secondary | ICD-10-CM | POA: Diagnosis not present

## 2020-10-30 DIAGNOSIS — M9901 Segmental and somatic dysfunction of cervical region: Secondary | ICD-10-CM | POA: Diagnosis not present

## 2020-10-30 DIAGNOSIS — M9903 Segmental and somatic dysfunction of lumbar region: Secondary | ICD-10-CM | POA: Diagnosis not present

## 2020-10-31 DIAGNOSIS — F5000 Anorexia nervosa, unspecified: Secondary | ICD-10-CM | POA: Diagnosis not present

## 2020-11-01 DIAGNOSIS — F5001 Anorexia nervosa, restricting type: Secondary | ICD-10-CM | POA: Diagnosis not present

## 2020-11-01 DIAGNOSIS — R040 Epistaxis: Secondary | ICD-10-CM | POA: Diagnosis not present

## 2020-11-03 DIAGNOSIS — Z713 Dietary counseling and surveillance: Secondary | ICD-10-CM | POA: Diagnosis not present

## 2020-11-07 DIAGNOSIS — F5000 Anorexia nervosa, unspecified: Secondary | ICD-10-CM | POA: Diagnosis not present

## 2020-11-08 DIAGNOSIS — F5001 Anorexia nervosa, restricting type: Secondary | ICD-10-CM | POA: Diagnosis not present

## 2020-11-08 DIAGNOSIS — R4689 Other symptoms and signs involving appearance and behavior: Secondary | ICD-10-CM | POA: Diagnosis not present

## 2020-11-08 DIAGNOSIS — M8580 Other specified disorders of bone density and structure, unspecified site: Secondary | ICD-10-CM | POA: Diagnosis not present

## 2020-11-10 DIAGNOSIS — Z713 Dietary counseling and surveillance: Secondary | ICD-10-CM | POA: Diagnosis not present

## 2020-11-14 DIAGNOSIS — F5000 Anorexia nervosa, unspecified: Secondary | ICD-10-CM | POA: Diagnosis not present

## 2020-11-15 DIAGNOSIS — F5001 Anorexia nervosa, restricting type: Secondary | ICD-10-CM | POA: Diagnosis not present

## 2020-11-15 DIAGNOSIS — M8580 Other specified disorders of bone density and structure, unspecified site: Secondary | ICD-10-CM | POA: Diagnosis not present

## 2020-11-15 DIAGNOSIS — F4322 Adjustment disorder with anxiety: Secondary | ICD-10-CM | POA: Diagnosis not present

## 2020-11-15 DIAGNOSIS — I951 Orthostatic hypotension: Secondary | ICD-10-CM | POA: Diagnosis not present

## 2020-11-15 DIAGNOSIS — R4689 Other symptoms and signs involving appearance and behavior: Secondary | ICD-10-CM | POA: Diagnosis not present

## 2020-11-17 DIAGNOSIS — Z713 Dietary counseling and surveillance: Secondary | ICD-10-CM | POA: Diagnosis not present

## 2020-11-21 DIAGNOSIS — F5000 Anorexia nervosa, unspecified: Secondary | ICD-10-CM | POA: Diagnosis not present

## 2020-11-22 DIAGNOSIS — R4689 Other symptoms and signs involving appearance and behavior: Secondary | ICD-10-CM | POA: Diagnosis not present

## 2020-11-22 DIAGNOSIS — F5001 Anorexia nervosa, restricting type: Secondary | ICD-10-CM | POA: Diagnosis not present

## 2020-11-27 DIAGNOSIS — M9902 Segmental and somatic dysfunction of thoracic region: Secondary | ICD-10-CM | POA: Diagnosis not present

## 2020-11-27 DIAGNOSIS — M9903 Segmental and somatic dysfunction of lumbar region: Secondary | ICD-10-CM | POA: Diagnosis not present

## 2020-11-27 DIAGNOSIS — M9901 Segmental and somatic dysfunction of cervical region: Secondary | ICD-10-CM | POA: Diagnosis not present

## 2020-11-27 DIAGNOSIS — M9905 Segmental and somatic dysfunction of pelvic region: Secondary | ICD-10-CM | POA: Diagnosis not present

## 2020-11-28 DIAGNOSIS — F5000 Anorexia nervosa, unspecified: Secondary | ICD-10-CM | POA: Diagnosis not present

## 2020-11-29 DIAGNOSIS — F5001 Anorexia nervosa, restricting type: Secondary | ICD-10-CM | POA: Diagnosis not present

## 2020-11-29 DIAGNOSIS — R4689 Other symptoms and signs involving appearance and behavior: Secondary | ICD-10-CM | POA: Diagnosis not present

## 2020-12-06 DIAGNOSIS — F5000 Anorexia nervosa, unspecified: Secondary | ICD-10-CM | POA: Diagnosis not present

## 2020-12-07 DIAGNOSIS — H10413 Chronic giant papillary conjunctivitis, bilateral: Secondary | ICD-10-CM | POA: Diagnosis not present

## 2020-12-07 DIAGNOSIS — H5213 Myopia, bilateral: Secondary | ICD-10-CM | POA: Diagnosis not present

## 2020-12-08 DIAGNOSIS — Z713 Dietary counseling and surveillance: Secondary | ICD-10-CM | POA: Diagnosis not present

## 2020-12-13 DIAGNOSIS — F5001 Anorexia nervosa, restricting type: Secondary | ICD-10-CM | POA: Diagnosis not present

## 2020-12-13 DIAGNOSIS — Z975 Presence of (intrauterine) contraceptive device: Secondary | ICD-10-CM | POA: Diagnosis not present

## 2020-12-13 DIAGNOSIS — F4322 Adjustment disorder with anxiety: Secondary | ICD-10-CM | POA: Diagnosis not present

## 2020-12-13 DIAGNOSIS — R4689 Other symptoms and signs involving appearance and behavior: Secondary | ICD-10-CM | POA: Diagnosis not present

## 2020-12-15 DIAGNOSIS — Z713 Dietary counseling and surveillance: Secondary | ICD-10-CM | POA: Diagnosis not present

## 2020-12-19 DIAGNOSIS — F5000 Anorexia nervosa, unspecified: Secondary | ICD-10-CM | POA: Diagnosis not present

## 2020-12-20 DIAGNOSIS — F5001 Anorexia nervosa, restricting type: Secondary | ICD-10-CM | POA: Diagnosis not present

## 2020-12-20 DIAGNOSIS — R4689 Other symptoms and signs involving appearance and behavior: Secondary | ICD-10-CM | POA: Diagnosis not present

## 2020-12-20 DIAGNOSIS — M79675 Pain in left toe(s): Secondary | ICD-10-CM | POA: Diagnosis not present

## 2020-12-22 DIAGNOSIS — Z713 Dietary counseling and surveillance: Secondary | ICD-10-CM | POA: Diagnosis not present

## 2020-12-25 DIAGNOSIS — M9902 Segmental and somatic dysfunction of thoracic region: Secondary | ICD-10-CM | POA: Diagnosis not present

## 2020-12-25 DIAGNOSIS — M9903 Segmental and somatic dysfunction of lumbar region: Secondary | ICD-10-CM | POA: Diagnosis not present

## 2020-12-25 DIAGNOSIS — M9905 Segmental and somatic dysfunction of pelvic region: Secondary | ICD-10-CM | POA: Diagnosis not present

## 2020-12-25 DIAGNOSIS — M9901 Segmental and somatic dysfunction of cervical region: Secondary | ICD-10-CM | POA: Diagnosis not present

## 2020-12-26 DIAGNOSIS — F5000 Anorexia nervosa, unspecified: Secondary | ICD-10-CM | POA: Diagnosis not present

## 2020-12-27 DIAGNOSIS — F4322 Adjustment disorder with anxiety: Secondary | ICD-10-CM | POA: Diagnosis not present

## 2020-12-27 DIAGNOSIS — F5001 Anorexia nervosa, restricting type: Secondary | ICD-10-CM | POA: Diagnosis not present

## 2020-12-27 DIAGNOSIS — R4689 Other symptoms and signs involving appearance and behavior: Secondary | ICD-10-CM | POA: Diagnosis not present

## 2020-12-29 DIAGNOSIS — Z713 Dietary counseling and surveillance: Secondary | ICD-10-CM | POA: Diagnosis not present

## 2021-01-02 DIAGNOSIS — F5000 Anorexia nervosa, unspecified: Secondary | ICD-10-CM | POA: Diagnosis not present

## 2021-01-04 DIAGNOSIS — R4689 Other symptoms and signs involving appearance and behavior: Secondary | ICD-10-CM | POA: Diagnosis not present

## 2021-01-04 DIAGNOSIS — F5001 Anorexia nervosa, restricting type: Secondary | ICD-10-CM | POA: Diagnosis not present

## 2021-01-05 DIAGNOSIS — Z713 Dietary counseling and surveillance: Secondary | ICD-10-CM | POA: Diagnosis not present

## 2021-01-10 DIAGNOSIS — R790 Abnormal level of blood mineral: Secondary | ICD-10-CM | POA: Diagnosis not present

## 2021-01-10 DIAGNOSIS — R4689 Other symptoms and signs involving appearance and behavior: Secondary | ICD-10-CM | POA: Diagnosis not present

## 2021-01-10 DIAGNOSIS — F5001 Anorexia nervosa, restricting type: Secondary | ICD-10-CM | POA: Diagnosis not present

## 2021-01-11 DIAGNOSIS — F5000 Anorexia nervosa, unspecified: Secondary | ICD-10-CM | POA: Diagnosis not present

## 2021-01-12 DIAGNOSIS — Z713 Dietary counseling and surveillance: Secondary | ICD-10-CM | POA: Diagnosis not present

## 2021-01-16 DIAGNOSIS — F5000 Anorexia nervosa, unspecified: Secondary | ICD-10-CM | POA: Diagnosis not present

## 2021-01-17 DIAGNOSIS — F5001 Anorexia nervosa, restricting type: Secondary | ICD-10-CM | POA: Diagnosis not present

## 2021-01-18 DIAGNOSIS — Z713 Dietary counseling and surveillance: Secondary | ICD-10-CM | POA: Diagnosis not present

## 2021-01-22 DIAGNOSIS — M9901 Segmental and somatic dysfunction of cervical region: Secondary | ICD-10-CM | POA: Diagnosis not present

## 2021-01-22 DIAGNOSIS — M9902 Segmental and somatic dysfunction of thoracic region: Secondary | ICD-10-CM | POA: Diagnosis not present

## 2021-01-22 DIAGNOSIS — M9905 Segmental and somatic dysfunction of pelvic region: Secondary | ICD-10-CM | POA: Diagnosis not present

## 2021-01-22 DIAGNOSIS — M9903 Segmental and somatic dysfunction of lumbar region: Secondary | ICD-10-CM | POA: Diagnosis not present

## 2021-01-23 DIAGNOSIS — F5000 Anorexia nervosa, unspecified: Secondary | ICD-10-CM | POA: Diagnosis not present

## 2021-01-24 DIAGNOSIS — F5001 Anorexia nervosa, restricting type: Secondary | ICD-10-CM | POA: Diagnosis not present

## 2021-01-24 DIAGNOSIS — F4322 Adjustment disorder with anxiety: Secondary | ICD-10-CM | POA: Diagnosis not present

## 2021-01-26 DIAGNOSIS — F5000 Anorexia nervosa, unspecified: Secondary | ICD-10-CM | POA: Diagnosis not present

## 2021-01-26 DIAGNOSIS — Z713 Dietary counseling and surveillance: Secondary | ICD-10-CM | POA: Diagnosis not present

## 2021-01-31 DIAGNOSIS — R790 Abnormal level of blood mineral: Secondary | ICD-10-CM | POA: Diagnosis not present

## 2021-01-31 DIAGNOSIS — F4322 Adjustment disorder with anxiety: Secondary | ICD-10-CM | POA: Diagnosis not present

## 2021-01-31 DIAGNOSIS — R4689 Other symptoms and signs involving appearance and behavior: Secondary | ICD-10-CM | POA: Diagnosis not present

## 2021-01-31 DIAGNOSIS — F5001 Anorexia nervosa, restricting type: Secondary | ICD-10-CM | POA: Diagnosis not present

## 2021-02-02 DIAGNOSIS — Z713 Dietary counseling and surveillance: Secondary | ICD-10-CM | POA: Diagnosis not present

## 2021-02-07 DIAGNOSIS — F5001 Anorexia nervosa, restricting type: Secondary | ICD-10-CM | POA: Diagnosis not present

## 2021-02-07 DIAGNOSIS — F4322 Adjustment disorder with anxiety: Secondary | ICD-10-CM | POA: Diagnosis not present

## 2021-02-07 DIAGNOSIS — R4689 Other symptoms and signs involving appearance and behavior: Secondary | ICD-10-CM | POA: Diagnosis not present

## 2021-02-09 DIAGNOSIS — Z713 Dietary counseling and surveillance: Secondary | ICD-10-CM | POA: Diagnosis not present

## 2021-02-13 DIAGNOSIS — F5000 Anorexia nervosa, unspecified: Secondary | ICD-10-CM | POA: Diagnosis not present

## 2021-02-14 DIAGNOSIS — F4322 Adjustment disorder with anxiety: Secondary | ICD-10-CM | POA: Diagnosis not present

## 2021-02-14 DIAGNOSIS — F5001 Anorexia nervosa, restricting type: Secondary | ICD-10-CM | POA: Diagnosis not present

## 2021-02-14 DIAGNOSIS — R4689 Other symptoms and signs involving appearance and behavior: Secondary | ICD-10-CM | POA: Diagnosis not present

## 2021-02-19 DIAGNOSIS — M9903 Segmental and somatic dysfunction of lumbar region: Secondary | ICD-10-CM | POA: Diagnosis not present

## 2021-02-19 DIAGNOSIS — M9902 Segmental and somatic dysfunction of thoracic region: Secondary | ICD-10-CM | POA: Diagnosis not present

## 2021-02-19 DIAGNOSIS — M9901 Segmental and somatic dysfunction of cervical region: Secondary | ICD-10-CM | POA: Diagnosis not present

## 2021-02-19 DIAGNOSIS — M9905 Segmental and somatic dysfunction of pelvic region: Secondary | ICD-10-CM | POA: Diagnosis not present

## 2021-02-20 DIAGNOSIS — Z713 Dietary counseling and surveillance: Secondary | ICD-10-CM | POA: Diagnosis not present

## 2021-02-20 DIAGNOSIS — F5000 Anorexia nervosa, unspecified: Secondary | ICD-10-CM | POA: Diagnosis not present

## 2021-02-21 DIAGNOSIS — F5001 Anorexia nervosa, restricting type: Secondary | ICD-10-CM | POA: Diagnosis not present

## 2021-02-27 DIAGNOSIS — Z713 Dietary counseling and surveillance: Secondary | ICD-10-CM | POA: Diagnosis not present

## 2021-02-27 DIAGNOSIS — F5000 Anorexia nervosa, unspecified: Secondary | ICD-10-CM | POA: Diagnosis not present

## 2021-02-28 DIAGNOSIS — R4689 Other symptoms and signs involving appearance and behavior: Secondary | ICD-10-CM | POA: Diagnosis not present

## 2021-02-28 DIAGNOSIS — F5001 Anorexia nervosa, restricting type: Secondary | ICD-10-CM | POA: Diagnosis not present

## 2021-03-13 DIAGNOSIS — F5000 Anorexia nervosa, unspecified: Secondary | ICD-10-CM | POA: Diagnosis not present

## 2021-03-13 DIAGNOSIS — Z713 Dietary counseling and surveillance: Secondary | ICD-10-CM | POA: Diagnosis not present

## 2021-03-14 DIAGNOSIS — F5001 Anorexia nervosa, restricting type: Secondary | ICD-10-CM | POA: Diagnosis not present

## 2021-03-14 DIAGNOSIS — R4689 Other symptoms and signs involving appearance and behavior: Secondary | ICD-10-CM | POA: Diagnosis not present

## 2021-03-14 DIAGNOSIS — R790 Abnormal level of blood mineral: Secondary | ICD-10-CM | POA: Diagnosis not present

## 2021-03-14 DIAGNOSIS — M8580 Other specified disorders of bone density and structure, unspecified site: Secondary | ICD-10-CM | POA: Diagnosis not present

## 2021-03-14 DIAGNOSIS — Z5181 Encounter for therapeutic drug level monitoring: Secondary | ICD-10-CM | POA: Diagnosis not present

## 2021-03-15 DIAGNOSIS — M9902 Segmental and somatic dysfunction of thoracic region: Secondary | ICD-10-CM | POA: Diagnosis not present

## 2021-03-15 DIAGNOSIS — M9905 Segmental and somatic dysfunction of pelvic region: Secondary | ICD-10-CM | POA: Diagnosis not present

## 2021-03-15 DIAGNOSIS — M9903 Segmental and somatic dysfunction of lumbar region: Secondary | ICD-10-CM | POA: Diagnosis not present

## 2021-03-15 DIAGNOSIS — M9901 Segmental and somatic dysfunction of cervical region: Secondary | ICD-10-CM | POA: Diagnosis not present

## 2021-03-20 DIAGNOSIS — F5000 Anorexia nervosa, unspecified: Secondary | ICD-10-CM | POA: Diagnosis not present

## 2021-03-20 DIAGNOSIS — Z713 Dietary counseling and surveillance: Secondary | ICD-10-CM | POA: Diagnosis not present

## 2021-03-21 DIAGNOSIS — F5001 Anorexia nervosa, restricting type: Secondary | ICD-10-CM | POA: Diagnosis not present

## 2021-03-21 DIAGNOSIS — R5383 Other fatigue: Secondary | ICD-10-CM | POA: Diagnosis not present

## 2021-03-21 DIAGNOSIS — R4689 Other symptoms and signs involving appearance and behavior: Secondary | ICD-10-CM | POA: Diagnosis not present

## 2021-03-28 DIAGNOSIS — R5383 Other fatigue: Secondary | ICD-10-CM | POA: Diagnosis not present

## 2021-03-28 DIAGNOSIS — F5001 Anorexia nervosa, restricting type: Secondary | ICD-10-CM | POA: Diagnosis not present

## 2021-03-28 DIAGNOSIS — R4689 Other symptoms and signs involving appearance and behavior: Secondary | ICD-10-CM | POA: Diagnosis not present

## 2021-04-03 DIAGNOSIS — F5000 Anorexia nervosa, unspecified: Secondary | ICD-10-CM | POA: Diagnosis not present

## 2021-04-10 DIAGNOSIS — F5000 Anorexia nervosa, unspecified: Secondary | ICD-10-CM | POA: Diagnosis not present

## 2021-04-10 DIAGNOSIS — Z713 Dietary counseling and surveillance: Secondary | ICD-10-CM | POA: Diagnosis not present

## 2021-04-12 DIAGNOSIS — M9901 Segmental and somatic dysfunction of cervical region: Secondary | ICD-10-CM | POA: Diagnosis not present

## 2021-04-12 DIAGNOSIS — M9905 Segmental and somatic dysfunction of pelvic region: Secondary | ICD-10-CM | POA: Diagnosis not present

## 2021-04-12 DIAGNOSIS — M9902 Segmental and somatic dysfunction of thoracic region: Secondary | ICD-10-CM | POA: Diagnosis not present

## 2021-04-12 DIAGNOSIS — M9903 Segmental and somatic dysfunction of lumbar region: Secondary | ICD-10-CM | POA: Diagnosis not present

## 2021-04-13 DIAGNOSIS — R790 Abnormal level of blood mineral: Secondary | ICD-10-CM | POA: Diagnosis not present

## 2021-04-13 DIAGNOSIS — F4322 Adjustment disorder with anxiety: Secondary | ICD-10-CM | POA: Diagnosis not present

## 2021-04-13 DIAGNOSIS — F5001 Anorexia nervosa, restricting type: Secondary | ICD-10-CM | POA: Diagnosis not present

## 2021-04-13 DIAGNOSIS — R4689 Other symptoms and signs involving appearance and behavior: Secondary | ICD-10-CM | POA: Diagnosis not present

## 2021-04-17 DIAGNOSIS — F5000 Anorexia nervosa, unspecified: Secondary | ICD-10-CM | POA: Diagnosis not present

## 2021-04-17 DIAGNOSIS — Z713 Dietary counseling and surveillance: Secondary | ICD-10-CM | POA: Diagnosis not present

## 2021-04-18 DIAGNOSIS — F5001 Anorexia nervosa, restricting type: Secondary | ICD-10-CM | POA: Diagnosis not present

## 2021-04-18 DIAGNOSIS — F4322 Adjustment disorder with anxiety: Secondary | ICD-10-CM | POA: Diagnosis not present

## 2021-04-18 DIAGNOSIS — R4689 Other symptoms and signs involving appearance and behavior: Secondary | ICD-10-CM | POA: Diagnosis not present

## 2021-04-24 DIAGNOSIS — Z713 Dietary counseling and surveillance: Secondary | ICD-10-CM | POA: Diagnosis not present

## 2021-04-24 DIAGNOSIS — F5000 Anorexia nervosa, unspecified: Secondary | ICD-10-CM | POA: Diagnosis not present

## 2021-04-28 DIAGNOSIS — H00012 Hordeolum externum right lower eyelid: Secondary | ICD-10-CM | POA: Diagnosis not present

## 2021-05-01 DIAGNOSIS — F5000 Anorexia nervosa, unspecified: Secondary | ICD-10-CM | POA: Diagnosis not present

## 2021-05-02 DIAGNOSIS — F5001 Anorexia nervosa, restricting type: Secondary | ICD-10-CM | POA: Diagnosis not present

## 2021-05-02 DIAGNOSIS — H00014 Hordeolum externum left upper eyelid: Secondary | ICD-10-CM | POA: Diagnosis not present

## 2021-05-03 DIAGNOSIS — Z713 Dietary counseling and surveillance: Secondary | ICD-10-CM | POA: Diagnosis not present

## 2021-05-08 DIAGNOSIS — F5000 Anorexia nervosa, unspecified: Secondary | ICD-10-CM | POA: Diagnosis not present

## 2021-05-09 DIAGNOSIS — F5001 Anorexia nervosa, restricting type: Secondary | ICD-10-CM | POA: Diagnosis not present

## 2021-05-10 DIAGNOSIS — M9901 Segmental and somatic dysfunction of cervical region: Secondary | ICD-10-CM | POA: Diagnosis not present

## 2021-05-10 DIAGNOSIS — Z713 Dietary counseling and surveillance: Secondary | ICD-10-CM | POA: Diagnosis not present

## 2021-05-10 DIAGNOSIS — M9903 Segmental and somatic dysfunction of lumbar region: Secondary | ICD-10-CM | POA: Diagnosis not present

## 2021-05-10 DIAGNOSIS — M9902 Segmental and somatic dysfunction of thoracic region: Secondary | ICD-10-CM | POA: Diagnosis not present

## 2021-05-10 DIAGNOSIS — M9905 Segmental and somatic dysfunction of pelvic region: Secondary | ICD-10-CM | POA: Diagnosis not present

## 2021-05-15 DIAGNOSIS — F5000 Anorexia nervosa, unspecified: Secondary | ICD-10-CM | POA: Diagnosis not present

## 2021-05-16 DIAGNOSIS — F5001 Anorexia nervosa, restricting type: Secondary | ICD-10-CM | POA: Diagnosis not present

## 2021-05-17 DIAGNOSIS — Z713 Dietary counseling and surveillance: Secondary | ICD-10-CM | POA: Diagnosis not present

## 2021-05-22 DIAGNOSIS — F5000 Anorexia nervosa, unspecified: Secondary | ICD-10-CM | POA: Diagnosis not present

## 2021-05-23 DIAGNOSIS — F5001 Anorexia nervosa, restricting type: Secondary | ICD-10-CM | POA: Diagnosis not present

## 2021-05-23 DIAGNOSIS — R790 Abnormal level of blood mineral: Secondary | ICD-10-CM | POA: Diagnosis not present

## 2021-05-25 DIAGNOSIS — Z713 Dietary counseling and surveillance: Secondary | ICD-10-CM | POA: Diagnosis not present

## 2021-05-29 DIAGNOSIS — F5000 Anorexia nervosa, unspecified: Secondary | ICD-10-CM | POA: Diagnosis not present

## 2021-05-30 DIAGNOSIS — F5001 Anorexia nervosa, restricting type: Secondary | ICD-10-CM | POA: Diagnosis not present

## 2021-05-31 DIAGNOSIS — Z713 Dietary counseling and surveillance: Secondary | ICD-10-CM | POA: Diagnosis not present

## 2021-06-01 DIAGNOSIS — Z79891 Long term (current) use of opiate analgesic: Secondary | ICD-10-CM | POA: Diagnosis not present

## 2021-06-05 DIAGNOSIS — F5000 Anorexia nervosa, unspecified: Secondary | ICD-10-CM | POA: Diagnosis not present

## 2021-06-06 DIAGNOSIS — Z23 Encounter for immunization: Secondary | ICD-10-CM | POA: Diagnosis not present

## 2021-06-06 DIAGNOSIS — F5001 Anorexia nervosa, restricting type: Secondary | ICD-10-CM | POA: Diagnosis not present

## 2021-06-07 DIAGNOSIS — Z713 Dietary counseling and surveillance: Secondary | ICD-10-CM | POA: Diagnosis not present

## 2021-06-08 DIAGNOSIS — F429 Obsessive-compulsive disorder, unspecified: Secondary | ICD-10-CM | POA: Diagnosis not present

## 2021-06-08 DIAGNOSIS — F509 Eating disorder, unspecified: Secondary | ICD-10-CM | POA: Diagnosis not present

## 2021-06-08 DIAGNOSIS — F419 Anxiety disorder, unspecified: Secondary | ICD-10-CM | POA: Diagnosis not present

## 2021-06-12 DIAGNOSIS — F5000 Anorexia nervosa, unspecified: Secondary | ICD-10-CM | POA: Diagnosis not present

## 2021-06-13 DIAGNOSIS — R4689 Other symptoms and signs involving appearance and behavior: Secondary | ICD-10-CM | POA: Diagnosis not present

## 2021-06-13 DIAGNOSIS — F4322 Adjustment disorder with anxiety: Secondary | ICD-10-CM | POA: Diagnosis not present

## 2021-06-13 DIAGNOSIS — F5001 Anorexia nervosa, restricting type: Secondary | ICD-10-CM | POA: Diagnosis not present

## 2021-06-14 DIAGNOSIS — M9901 Segmental and somatic dysfunction of cervical region: Secondary | ICD-10-CM | POA: Diagnosis not present

## 2021-06-14 DIAGNOSIS — M9905 Segmental and somatic dysfunction of pelvic region: Secondary | ICD-10-CM | POA: Diagnosis not present

## 2021-06-14 DIAGNOSIS — M9903 Segmental and somatic dysfunction of lumbar region: Secondary | ICD-10-CM | POA: Diagnosis not present

## 2021-06-14 DIAGNOSIS — M7918 Myalgia, other site: Secondary | ICD-10-CM | POA: Diagnosis not present

## 2021-06-14 DIAGNOSIS — Z713 Dietary counseling and surveillance: Secondary | ICD-10-CM | POA: Diagnosis not present

## 2021-06-14 DIAGNOSIS — M9902 Segmental and somatic dysfunction of thoracic region: Secondary | ICD-10-CM | POA: Diagnosis not present

## 2021-06-20 DIAGNOSIS — F5001 Anorexia nervosa, restricting type: Secondary | ICD-10-CM | POA: Diagnosis not present

## 2021-06-20 DIAGNOSIS — Z5181 Encounter for therapeutic drug level monitoring: Secondary | ICD-10-CM | POA: Diagnosis not present

## 2021-06-20 DIAGNOSIS — Z23 Encounter for immunization: Secondary | ICD-10-CM | POA: Diagnosis not present

## 2021-06-21 DIAGNOSIS — F5000 Anorexia nervosa, unspecified: Secondary | ICD-10-CM | POA: Diagnosis not present

## 2021-06-21 DIAGNOSIS — Z713 Dietary counseling and surveillance: Secondary | ICD-10-CM | POA: Diagnosis not present

## 2021-06-26 DIAGNOSIS — F5000 Anorexia nervosa, unspecified: Secondary | ICD-10-CM | POA: Diagnosis not present

## 2021-06-27 DIAGNOSIS — F5001 Anorexia nervosa, restricting type: Secondary | ICD-10-CM | POA: Diagnosis not present

## 2021-06-28 DIAGNOSIS — Z713 Dietary counseling and surveillance: Secondary | ICD-10-CM | POA: Diagnosis not present

## 2021-07-04 DIAGNOSIS — F5001 Anorexia nervosa, restricting type: Secondary | ICD-10-CM | POA: Diagnosis not present

## 2021-07-05 DIAGNOSIS — Z713 Dietary counseling and surveillance: Secondary | ICD-10-CM | POA: Diagnosis not present

## 2021-07-10 DIAGNOSIS — F5000 Anorexia nervosa, unspecified: Secondary | ICD-10-CM | POA: Diagnosis not present

## 2021-07-11 DIAGNOSIS — R4689 Other symptoms and signs involving appearance and behavior: Secondary | ICD-10-CM | POA: Diagnosis not present

## 2021-07-11 DIAGNOSIS — F4322 Adjustment disorder with anxiety: Secondary | ICD-10-CM | POA: Diagnosis not present

## 2021-07-11 DIAGNOSIS — F5001 Anorexia nervosa, restricting type: Secondary | ICD-10-CM | POA: Diagnosis not present

## 2021-07-12 DIAGNOSIS — M545 Low back pain, unspecified: Secondary | ICD-10-CM | POA: Diagnosis not present

## 2021-07-12 DIAGNOSIS — M9903 Segmental and somatic dysfunction of lumbar region: Secondary | ICD-10-CM | POA: Diagnosis not present

## 2021-07-12 DIAGNOSIS — M7918 Myalgia, other site: Secondary | ICD-10-CM | POA: Diagnosis not present

## 2021-07-12 DIAGNOSIS — M9901 Segmental and somatic dysfunction of cervical region: Secondary | ICD-10-CM | POA: Diagnosis not present

## 2021-07-12 DIAGNOSIS — M9905 Segmental and somatic dysfunction of pelvic region: Secondary | ICD-10-CM | POA: Diagnosis not present

## 2021-07-12 DIAGNOSIS — M9902 Segmental and somatic dysfunction of thoracic region: Secondary | ICD-10-CM | POA: Diagnosis not present

## 2021-07-12 DIAGNOSIS — Z713 Dietary counseling and surveillance: Secondary | ICD-10-CM | POA: Diagnosis not present

## 2021-07-17 DIAGNOSIS — F5000 Anorexia nervosa, unspecified: Secondary | ICD-10-CM | POA: Diagnosis not present

## 2021-07-18 DIAGNOSIS — F5001 Anorexia nervosa, restricting type: Secondary | ICD-10-CM | POA: Diagnosis not present

## 2021-07-19 DIAGNOSIS — F419 Anxiety disorder, unspecified: Secondary | ICD-10-CM | POA: Diagnosis not present

## 2021-07-19 DIAGNOSIS — F509 Eating disorder, unspecified: Secondary | ICD-10-CM | POA: Diagnosis not present

## 2021-07-19 DIAGNOSIS — F429 Obsessive-compulsive disorder, unspecified: Secondary | ICD-10-CM | POA: Diagnosis not present

## 2021-07-24 DIAGNOSIS — F5000 Anorexia nervosa, unspecified: Secondary | ICD-10-CM | POA: Diagnosis not present

## 2021-07-25 DIAGNOSIS — F5001 Anorexia nervosa, restricting type: Secondary | ICD-10-CM | POA: Diagnosis not present

## 2021-07-25 DIAGNOSIS — F4322 Adjustment disorder with anxiety: Secondary | ICD-10-CM | POA: Diagnosis not present

## 2021-07-26 DIAGNOSIS — Z713 Dietary counseling and surveillance: Secondary | ICD-10-CM | POA: Diagnosis not present

## 2021-08-01 DIAGNOSIS — F5001 Anorexia nervosa, restricting type: Secondary | ICD-10-CM | POA: Diagnosis not present

## 2021-08-02 DIAGNOSIS — Z713 Dietary counseling and surveillance: Secondary | ICD-10-CM | POA: Diagnosis not present

## 2021-08-08 DIAGNOSIS — F5001 Anorexia nervosa, restricting type: Secondary | ICD-10-CM | POA: Diagnosis not present

## 2021-08-08 DIAGNOSIS — F5000 Anorexia nervosa, unspecified: Secondary | ICD-10-CM | POA: Diagnosis not present

## 2021-08-09 DIAGNOSIS — Z713 Dietary counseling and surveillance: Secondary | ICD-10-CM | POA: Diagnosis not present

## 2021-08-13 DIAGNOSIS — M9903 Segmental and somatic dysfunction of lumbar region: Secondary | ICD-10-CM | POA: Diagnosis not present

## 2021-08-13 DIAGNOSIS — M9907 Segmental and somatic dysfunction of upper extremity: Secondary | ICD-10-CM | POA: Diagnosis not present

## 2021-08-13 DIAGNOSIS — M9902 Segmental and somatic dysfunction of thoracic region: Secondary | ICD-10-CM | POA: Diagnosis not present

## 2021-08-13 DIAGNOSIS — M9901 Segmental and somatic dysfunction of cervical region: Secondary | ICD-10-CM | POA: Diagnosis not present

## 2021-08-14 DIAGNOSIS — F5000 Anorexia nervosa, unspecified: Secondary | ICD-10-CM | POA: Diagnosis not present

## 2021-08-16 DIAGNOSIS — Z713 Dietary counseling and surveillance: Secondary | ICD-10-CM | POA: Diagnosis not present

## 2021-08-17 DIAGNOSIS — F429 Obsessive-compulsive disorder, unspecified: Secondary | ICD-10-CM | POA: Diagnosis not present

## 2021-08-17 DIAGNOSIS — F419 Anxiety disorder, unspecified: Secondary | ICD-10-CM | POA: Diagnosis not present

## 2021-08-17 DIAGNOSIS — F509 Eating disorder, unspecified: Secondary | ICD-10-CM | POA: Diagnosis not present

## 2021-08-21 DIAGNOSIS — F5000 Anorexia nervosa, unspecified: Secondary | ICD-10-CM | POA: Diagnosis not present

## 2021-08-22 DIAGNOSIS — F5001 Anorexia nervosa, restricting type: Secondary | ICD-10-CM | POA: Diagnosis not present

## 2021-08-22 DIAGNOSIS — F4322 Adjustment disorder with anxiety: Secondary | ICD-10-CM | POA: Diagnosis not present

## 2021-08-28 DIAGNOSIS — F5000 Anorexia nervosa, unspecified: Secondary | ICD-10-CM | POA: Diagnosis not present

## 2021-08-30 DIAGNOSIS — Z713 Dietary counseling and surveillance: Secondary | ICD-10-CM | POA: Diagnosis not present

## 2021-09-04 DIAGNOSIS — F5089 Other specified eating disorder: Secondary | ICD-10-CM | POA: Diagnosis not present

## 2021-09-04 DIAGNOSIS — F411 Generalized anxiety disorder: Secondary | ICD-10-CM | POA: Diagnosis not present

## 2021-09-05 DIAGNOSIS — F5001 Anorexia nervosa, restricting type: Secondary | ICD-10-CM | POA: Diagnosis not present

## 2021-09-06 DIAGNOSIS — Z713 Dietary counseling and surveillance: Secondary | ICD-10-CM | POA: Diagnosis not present

## 2021-09-10 DIAGNOSIS — M9907 Segmental and somatic dysfunction of upper extremity: Secondary | ICD-10-CM | POA: Diagnosis not present

## 2021-09-10 DIAGNOSIS — M9902 Segmental and somatic dysfunction of thoracic region: Secondary | ICD-10-CM | POA: Diagnosis not present

## 2021-09-10 DIAGNOSIS — M9901 Segmental and somatic dysfunction of cervical region: Secondary | ICD-10-CM | POA: Diagnosis not present

## 2021-09-10 DIAGNOSIS — M9903 Segmental and somatic dysfunction of lumbar region: Secondary | ICD-10-CM | POA: Diagnosis not present

## 2021-09-11 DIAGNOSIS — F5089 Other specified eating disorder: Secondary | ICD-10-CM | POA: Diagnosis not present

## 2021-09-11 DIAGNOSIS — F411 Generalized anxiety disorder: Secondary | ICD-10-CM | POA: Diagnosis not present

## 2021-09-13 DIAGNOSIS — Z713 Dietary counseling and surveillance: Secondary | ICD-10-CM | POA: Diagnosis not present

## 2021-09-21 DIAGNOSIS — Z5181 Encounter for therapeutic drug level monitoring: Secondary | ICD-10-CM | POA: Diagnosis not present

## 2021-09-21 DIAGNOSIS — R4689 Other symptoms and signs involving appearance and behavior: Secondary | ICD-10-CM | POA: Diagnosis not present

## 2021-09-21 DIAGNOSIS — F5001 Anorexia nervosa, restricting type: Secondary | ICD-10-CM | POA: Diagnosis not present

## 2021-09-25 DIAGNOSIS — F411 Generalized anxiety disorder: Secondary | ICD-10-CM | POA: Diagnosis not present

## 2021-09-25 DIAGNOSIS — F5089 Other specified eating disorder: Secondary | ICD-10-CM | POA: Diagnosis not present

## 2021-09-27 DIAGNOSIS — Z713 Dietary counseling and surveillance: Secondary | ICD-10-CM | POA: Diagnosis not present
